# Patient Record
Sex: Male | Born: 1945
Health system: Southern US, Community
[De-identification: ages and names within clinical notes are randomized; demographics above are authoritative.]

## PROBLEM LIST (undated history)

## (undated) DIAGNOSIS — K219 Gastro-esophageal reflux disease without esophagitis: Secondary | ICD-10-CM

## (undated) DIAGNOSIS — M1990 Unspecified osteoarthritis, unspecified site: Secondary | ICD-10-CM

## (undated) DIAGNOSIS — K635 Polyp of colon: Secondary | ICD-10-CM

## (undated) DIAGNOSIS — K449 Diaphragmatic hernia without obstruction or gangrene: Secondary | ICD-10-CM

## (undated) DIAGNOSIS — E785 Hyperlipidemia, unspecified: Secondary | ICD-10-CM

## (undated) DIAGNOSIS — K222 Esophageal obstruction: Secondary | ICD-10-CM

## (undated) DIAGNOSIS — N189 Chronic kidney disease, unspecified: Secondary | ICD-10-CM

## (undated) DIAGNOSIS — E039 Hypothyroidism, unspecified: Secondary | ICD-10-CM

## (undated) HISTORY — PX: COLONOSCOPY: SHX174

## (undated) HISTORY — DX: Diaphragmatic hernia without obstruction or gangrene: K44.9

## (undated) HISTORY — DX: Polyp of colon: K63.5

## (undated) HISTORY — PX: UPPER GASTROINTESTINAL ENDOSCOPY: SHX188

## (undated) HISTORY — DX: Unspecified osteoarthritis, unspecified site: M19.90

## (undated) HISTORY — DX: Esophageal obstruction: K22.2

## (undated) HISTORY — DX: Hyperlipidemia, unspecified: E78.5

## (undated) HISTORY — DX: Chronic kidney disease, unspecified: N18.9

## (undated) HISTORY — PX: ACHILLES TENDON REPAIR: SUR1153

## (undated) HISTORY — DX: Gastro-esophageal reflux disease without esophagitis: K21.9

## (undated) HISTORY — DX: Hypothyroidism, unspecified: E03.9

---

## 2000-01-04 ENCOUNTER — Encounter (INDEPENDENT_AMBULATORY_CARE_PROVIDER_SITE_OTHER): Payer: Self-pay | Admitting: *Deleted

## 2000-01-04 ENCOUNTER — Encounter: Payer: Self-pay | Admitting: Family Medicine

## 2000-01-04 ENCOUNTER — Ambulatory Visit (HOSPITAL_COMMUNITY): Admission: RE | Admit: 2000-01-04 | Discharge: 2000-01-04 | Payer: Self-pay | Admitting: Family Medicine

## 2001-11-21 ENCOUNTER — Ambulatory Visit (HOSPITAL_COMMUNITY): Admission: RE | Admit: 2001-11-21 | Discharge: 2001-11-21 | Payer: Self-pay | Admitting: Family Medicine

## 2001-11-21 ENCOUNTER — Encounter: Payer: Self-pay | Admitting: Family Medicine

## 2002-04-30 ENCOUNTER — Encounter: Payer: Self-pay | Admitting: Internal Medicine

## 2006-12-16 ENCOUNTER — Ambulatory Visit: Payer: Self-pay | Admitting: Gastroenterology

## 2006-12-30 ENCOUNTER — Ambulatory Visit: Payer: Self-pay | Admitting: Gastroenterology

## 2006-12-30 ENCOUNTER — Encounter: Payer: Self-pay | Admitting: Gastroenterology

## 2009-07-06 ENCOUNTER — Encounter (INDEPENDENT_AMBULATORY_CARE_PROVIDER_SITE_OTHER): Payer: Self-pay | Admitting: *Deleted

## 2009-10-25 ENCOUNTER — Telehealth: Payer: Self-pay | Admitting: Internal Medicine

## 2009-11-09 ENCOUNTER — Encounter: Payer: Self-pay | Admitting: Internal Medicine

## 2009-11-09 ENCOUNTER — Telehealth: Payer: Self-pay | Admitting: Internal Medicine

## 2009-12-06 DIAGNOSIS — Z860100 Personal history of colon polyps, unspecified: Secondary | ICD-10-CM

## 2009-12-06 DIAGNOSIS — R1013 Epigastric pain: Secondary | ICD-10-CM

## 2009-12-06 DIAGNOSIS — E785 Hyperlipidemia, unspecified: Secondary | ICD-10-CM

## 2009-12-06 DIAGNOSIS — E039 Hypothyroidism, unspecified: Secondary | ICD-10-CM | POA: Insufficient documentation

## 2009-12-06 DIAGNOSIS — K219 Gastro-esophageal reflux disease without esophagitis: Secondary | ICD-10-CM

## 2009-12-06 HISTORY — DX: Personal history of colonic polyps: Z86.010

## 2009-12-06 HISTORY — DX: Gastro-esophageal reflux disease without esophagitis: K21.9

## 2009-12-06 HISTORY — DX: Personal history of colon polyps, unspecified: Z86.0100

## 2009-12-06 HISTORY — DX: Epigastric pain: R10.13

## 2009-12-06 HISTORY — DX: Hyperlipidemia, unspecified: E78.5

## 2009-12-12 ENCOUNTER — Telehealth: Payer: Self-pay | Admitting: Internal Medicine

## 2009-12-15 ENCOUNTER — Ambulatory Visit: Payer: Self-pay | Admitting: Internal Medicine

## 2009-12-15 DIAGNOSIS — N62 Hypertrophy of breast: Secondary | ICD-10-CM

## 2009-12-15 HISTORY — DX: Hypertrophy of breast: N62

## 2010-01-18 ENCOUNTER — Ambulatory Visit
Admission: RE | Admit: 2010-01-18 | Discharge: 2010-01-18 | Payer: Self-pay | Source: Home / Self Care | Attending: Internal Medicine | Admitting: Internal Medicine

## 2010-01-18 ENCOUNTER — Encounter: Payer: Self-pay | Admitting: Internal Medicine

## 2010-01-19 ENCOUNTER — Telehealth (INDEPENDENT_AMBULATORY_CARE_PROVIDER_SITE_OTHER): Payer: Self-pay | Admitting: *Deleted

## 2010-01-21 ENCOUNTER — Encounter: Payer: Self-pay | Admitting: Internal Medicine

## 2010-02-14 NOTE — Assessment & Plan Note (Signed)
Summary: per dr Juanda Chance    History of Present Illness Visit Type: Follow-up Consult Primary GI MD: Lina Sar MD Primary Provider: Leonides Sake, MD Chief Complaint: abdominal pain  History of Present Illness:   This is a 65 year old white male with subxiphoid pain extending to the epigastrium which started approximately one year ago. It occurs almost daily. It is worse in the morning when he gets up. Patient has gained a considerable amount of weight in the last several years. He does not smoke. He does not drink alcohol. He complains of odynophagia to certain foods but denies dysphagia. He takes a Zantac 150 mg p.r.n. Patient saw [facility] gastroenterology less than one year ago and was asked to undergo an upper endoscopy but declined at that time. His sister has Barrett's esophagus.   GI Review of Systems    Reports abdominal pain, acid reflux, dysphagia with solids, and  heartburn.     Location of  Abdominal pain: epigastric area.    Denies belching, bloating, chest pain, dysphagia with liquids, loss of appetite, nausea, vomiting, vomiting blood, weight loss, and  weight gain.      Reports hemorrhoids.     Denies anal fissure, black tarry stools, change in bowel habit, constipation, diarrhea, diverticulosis, fecal incontinence, heme positive stool, irritable bowel syndrome, jaundice, light color stool, liver problems, rectal bleeding, and  rectal pain. Preventive Screening-Counseling & Management  Alcohol-Tobacco     Smoking Status: never    Current Medications (verified): 1)  Synthroid 125 Mcg Tabs (Levothyroxine Sodium) .Marland Kitchen.. 1 By Mouth Once Daily 2)  Zantac 150 Mg Tabs (Ranitidine Hcl) .... Take As Needed  Allergies (verified): 1)  ! Pcn  Past History:  Past Medical History: Reviewed history from 12/06/2009 and no changes required. Current Problems:  ABDOMINAL PAIN, EPIGASTRIC (ICD-789.06) HYPERLIPIDEMIA (ICD-272.4) GERD (ICD-530.81) HYPOTHYROIDISM (ICD-244.9) COLONIC  POLYPS, HYPERPLASTIC, HX OF (ICD-V12.72)    Past Surgical History: Reviewed history from 12/06/2009 and no changes required. Right Achilles Tendon Repair  Family History: Reviewed history from 12/06/2009 and no changes required. No FH of Colon Cancer: Family History of Pancreatic Cancer:Father Family History of Diabetes: Mother Family History of Heart Disease: Mother  Social History: Reviewed history from 12/06/2009 and no changes required. Alcohol Use - no Illicit Drug Use - no Married    3 boys Patient has never smoked.  Daily Caffeine Use Smoking Status:  never  Review of Systems  The patient denies allergy/sinus, anemia, anxiety-new, arthritis/joint pain, back pain, blood in urine, breast changes/lumps, change in vision, confusion, cough, coughing up blood, depression-new, fainting, fatigue, fever, headaches-new, hearing problems, heart murmur, heart rhythm changes, itching, muscle pains/cramps, night sweats, nosebleeds, shortness of breath, skin rash, sleeping problems, sore throat, swelling of feet/legs, swollen lymph glands, thirst - excessive, urination - excessive, urination changes/pain, urine leakage, vision changes, and voice change.         Pertinent positive and negative review of systems were noted in the above HPI. All other ROS was otherwise negative.   Vital Signs:  Patient profile:   65 year old male Height:      71 inches Weight:      255 pounds BMI:     35.69 Pulse rate:   68 / minute Pulse rhythm:   regular BP sitting:   128 / 84  (left arm)  Vitals Entered By: Milford Cage NCMA (December 15, 2009 9:01 AM)  Physical Exam  General:  Well developed, well nourished, no acute distress. Eyes:  PERRLA, no  icterus. Mouth:  No deformity or lesions, dentition normal. Neck:  Supple; no masses or thyromegaly. Breasts:  right sided gynecomastia Lungs:  Clear throughout to auscultation. Heart:  Regular rate and rhythm; no murmurs, rubs,  or bruits. Abdomen:   mildly protuberant abdomen with normoactive bowel sounds. No tenderness. Liver edge at costal margin. Lower abdomen unremarkable. No ascites. No CVA tenderness. Extremities:  No clubbing, cyanosis, edema or deformities noted. Skin:  Intact without significant lesions or rashes. Psych:  Alert and cooperative. Normal mood and affect.   Impression & Recommendations:  Problem # 1:  ABDOMINAL PAIN, EPIGASTRIC (ICD-789.06) Patient has subxiphoid pain likely related to gastroesophageal reflux or esophagitis. We need to rule out Barrett's esophagus or hiatal hernia. Patient was told that he had  a hernia many years ago. He needs an upper endoscopy and biopsies. We will start him on Nexium 40 mg twice a day for one week then once a day. We discussed antireflux measures.  Problem # 2:  GYNECOMASTIA, UNILATERAL (ICD-611.1) Patient has right-sided gynecomastia likely secondary to Zantac. He will discontinue Zantac.  Other Orders: EGD (EGD)  Patient Instructions: 1)  You have been scheduled for an endoscopy. Please follow written prep instructions that were given to you today at your visit.  2)  Please pick up your prescriptions at the pharmacy. Electronic prescription(s) has already been sent for Nexium 40 mg daily. 3)  take samples of Nexium 40 mg twice a day until gone then start to prescription once a day 4)  Discontinue Zantac. 5)  Copy sent to : Holley Bouche, MD 6)  The medication list was reviewed and reconciled.  All changed / newly prescribed medications were explained.  A complete medication list was provided to the patient / caregiver. Prescriptions: NEXIUM 40 MG CPDR (ESOMEPRAZOLE MAGNESIUM) Take 1 tablet by mouth once a day  #30 x 1   Entered by:   Lamona Curl CMA (AAMA)   Authorized by:   Hart Carwin MD   Signed by:   Lamona Curl CMA (AAMA) on 12/15/2009   Method used:   Electronically to        [id number]* (retail)       [address]  [address]       [place], [place]  [address]       Ph: [phone]       Fax: [phone]   RxID:   [id number]

## 2010-02-14 NOTE — Procedures (Signed)
Summary: COLONOSCOPY  COLONOSCOPY   Imported By: Lamona Curl CMA (AAMA) 12/06/2009 15:34:50  _____________________________________________________________________  External Attachment:    Type:   Image     Comment:   External Document

## 2010-02-14 NOTE — Progress Notes (Signed)
SummaryDeboraha Stevenson GI Patient   Phone Note Outgoing Call   Call placed by: Lamona Curl CMA Duncan Dull),  December 12, 2009 11:30 AM Call placed to: Patient Summary of Call: Left message on patient's voicemail that unfortunately, we must cancel his appointment for 12/15/09 as he never made [place] aware of the fact that he was seen at [facility] November 2010 (after seeing Dr Arlyce Dice). They will need to have all [facility] records sent to Dr Juanda Chance for review before an appointment can be made. Patient to call back with questions. Initial call taken by: Lamona Curl CMA Duncan Dull),  December 12, 2009 11:32 AM     Appended Document: Mario Stevenson GI Patient Patient's wife called back today. Very unpleasant and accusatory. She states that she is very upset that I left a message on home voicemail instead of trying to call cell number. I have advised her that I have no cell phone number on file to call. She states "well that is your office's fault." Advised her that while this may be true, I still do not have the number. She also states that she is very upset that they will have to wait a month to get in for something that could be very serious. I have advised her that I understand the importance of the appointment and that I always have Dr Juanda Chance look over faxed notes when they are recieved and then get the patient in for a date close to their original date. I have told that patient's wife that I have already talked to Dr Juanda Chance about the situation. She continues to be quite irrate. I have again asked her to have records sent for review and that I will contact them again at that time.  Appended Document: [facility] Patient Patient's wife paged Dr Juanda Chance and was advised that we will see patient.

## 2010-02-14 NOTE — Letter (Signed)
Summary: New Patient letter  [facility]  [address] [place], [place] [address]   Phone: [phone]  Fax: [phone]       11/09/2009 MRN: [id number]  Mario Stevenson [address] [place], [place]  [address]  Dear Mario Stevenson,  Welcome to the Gastroenterology Division at [facility].    You are scheduled to see Dr.  Juanda Chance on 12/15/09 at 8:45 on the 3rd floor at [facility], [address]  We ask that you try to arrive at our office 15 minutes prior to your appointment time to allow for check-in.  We would like you to complete the enclosed self-administered evaluation form prior to your visit and bring it with you on the day of your appointment.  We will review it with you.  Also, please bring a complete list of all your medications or, if you prefer, bring the medication bottles and we will list them.  Please bring your insurance card so that we may make a copy of it.  If your insurance requires a referral to see a specialist, please bring your referral form from your primary care physician.  Co-payments are due at the time of your visit and may be paid by cash, check or credit card.     Your office visit will consist of a consult with your physician (includes a physical exam), any laboratory testing he/she may order, scheduling of any necessary diagnostic testing (e.g. x-ray, ultrasound, CT-scan), and scheduling of a procedure (e.g. Endoscopy, Colonoscopy) if required.  Please allow enough time on your schedule to allow for any/all of these possibilities.    If you cannot keep your appointment, please call [phone] to cancel or reschedule prior to your appointment date.  This allows [place] the opportunity to schedule an appointment for another patient in need of care.  If you do not cancel or reschedule by 5 p.m. the business day prior to your appointment date, you will be charged a $50.00 late cancellation/no-show fee.    Thank you for  choosing [facility] for your medical needs.  We appreciate the opportunity to care for you.  Please visit [place] at our website  to learn more about our practice.                     Sincerely,                                                             The Gastroenterology Division

## 2010-02-14 NOTE — Progress Notes (Signed)
Summary: Triage   Phone Note Call from Patient Call back at Home Phone [phone]   Caller: Steward Drone  Call For: Dr. Juanda Chance Reason for Call: Talk to Nurse Summary of Call: pt.'s wife is requesting to speak directly with Dr. Juanda Chance.Marland KitchenMarland KitchenWanting to sch'd EGD w/Dr. Juanda Chance. Said he saw Dr. Juanda Chance in the past....Marland KitchenMarland KitchenHe is currently Dr. Marzetta Board pt.   Informed pt.'s wife our physician's do not share pt.'s but I could send a request to switch providers.  She then requested to speak directly w/Dr. Juanda Chance Initial call taken by: Karna Christmas,  October 25, 2009 9:19 AM  Follow-up for Phone Call        Patient's wife states that Dr Juanda Chance is their MD here that patient had a colon with Arlyce Dice.  They want to see Dr Juanda Chance.  Paper chart is on your desk.  I have advised the patient's wife that I will call back once I have all the information.  Dr Juanda Chance we have reviewed the paper chart can't find hx with you, but she says she is a friend of yours.  Do you want me to send a note to Dr Arlyce Dice about changing.  Aurea Graff says you all discussed discouraging changing MD's   Please advise Follow-up by: Darcey Nora RN, CGRN,  October 25, 2009 9:32 AM  Additional Follow-up for Phone Call Additional follow up Details #1::        Macarthur Lorusso is my patient . If her husband wants to switch doctors, let him follow our guidelines for switch. Additional Follow-up by: Hart Carwin MD,  October 25, 2009 10:18 PM    Additional Follow-up for Phone Call Additional follow up Details #2::    Dr Arlyce Dice, are you okay with patient switching to Dr Juanda Chance? Dottie Nelson-Smith CMA Duncan Dull)  October 26, 2009 8:07 AM   yes. Louis Meckel MD,  October 26, 2009 8:44 AM  Dr Juanda Chance- Will you accept Patient? Dottie Nelson-Smith CMA Duncan Dull)  October 26, 2009 10:01 AM   Additional Follow-up for Phone Call Additional follow up Details #3:: Details for Additional Follow-up Action Taken: OK Additional Follow-up by: Hart Carwin MD,   October 26, 2009 2:59 PM

## 2010-02-14 NOTE — Letter (Signed)
Summary: EGD Instructions  [facility]  [address] [place], [place] [address]   Phone: [phone]  Fax: [phone]       Mario Stevenson    March 04, 1945    MRN: [id number]       Procedure Day Dorna Bloom: Wednesday 01/18/10     Arrival Time: 1:00 pm     Procedure Time: 2:00 pm     Location of Procedure:                    _x _ [facility] (4th Floor)  PREPARATION FOR ENDOSCOPY   On 01/18/10 THE DAY OF THE PROCEDURE:  1.   No solid foods, milk or milk products are allowed after midnight the night before your procedure.  2.   Do not drink anything colored red or purple.  Avoid juices with pulp.  No orange juice.  3.  You may drink clear liquids until 12:00 pm, which is 2 hours before your procedure.                                                                                                CLEAR LIQUIDS INCLUDE: Water Jello Ice Popsicles Tea (sugar ok, no milk/cream) Powdered fruit flavored drinks Coffee (sugar ok, no milk/cream) Gatorade Juice: apple, white grape, white cranberry  Lemonade Clear bullion, consomm, broth Carbonated beverages (any kind) Strained chicken noodle soup Hard Candy   MEDICATION INSTRUCTIONS  Unless otherwise instructed, you should take regular prescription medications with a small sip of water as early as possible the morning of your procedure. \                    OTHER INSTRUCTIONS  You will need a responsible adult at least 64 years of age to accompany you and drive you home.   This person must remain in the waiting room during your procedure.  Wear loose fitting clothing that is easily removed.  Leave jewelry and other valuables at home.  However, you may wish to bring a book to read or an iPod/MP3 player to listen to music as you wait for your procedure to start.  Remove all body piercing jewelry and leave at home.  Total time from sign-in until discharge is approximately 2-3 hours.  You  should go home directly after your procedure and rest.  You can resume normal activities the day after your procedure.  The day of your procedure you should not:   Drive   Make legal decisions   Operate machinery   Drink alcohol   Return to work  You will receive specific instructions about eating, activities and medications before you leave.    The above instructions have been reviewed and explained to me by   _______________________    I fully understand and can verbalize these instructions _____________________________ Date _________

## 2010-02-14 NOTE — Progress Notes (Signed)
Summary: Office Visit  Office Visit   Imported By: Lamona Curl CMA (AAMA) 12/06/2009 15:37:29  _____________________________________________________________________  External Attachment:    Type:   Image     Comment:   External Document

## 2010-02-14 NOTE — Letter (Signed)
Summary: [facility]-Office Note  [facility] Note   Imported By: Lamona Curl CMA (AAMA) 12/12/2009 13:47:07  _____________________________________________________________________  External Attachment:    Type:   Image     Comment:   External Document

## 2010-02-14 NOTE — Progress Notes (Signed)
Summary: triage   Phone Note Call from Patient Call back at Home Phone [phone]   Caller: wife, Steward Drone Call For: Dr. Juanda Chance Reason for Call: Talk to Nurse Summary of Call: pt having upper abd pain... asked to switch from Dr. Arlyce Dice to Dr. Juanda Chance, per EMR switch has been approved... tried to sch pt's first appt with Dr. Juanda Chance for the upper abd pain... offered next availale NP3 and wife instantly became angry athaving to wait two weeks for switch approvaland now having to wait another month for an appt... explained to wife that when pt chose to switch physicians, it is typical for this to take 2 weeks... also informed wife that pt has chosen to switch to the physician who is usually sched out the farthest within our practice... wife disregarded what I said and told me that I dont understand, her husband is needing to be seen NOW and wants an EGD... explained to wife that pt must see Dr. Juanda Chance in the office for evaluation before she will perform an EGD if that is even necessary Initial call taken by: Vallarie Mare,  November 09, 2009 11:29 AM  Follow-up for Phone Call        I spoke with the pt's wife.  She was very pleasant. She however was upset he will have to wait until Dec 15th to be seen.  I have moved this appt up to 12/15/09 8:45.   He is having some upper abdominal pain and his primary care  Dr Tiburcio Pea suggested they come here. Pt is taking zantac q am only with no relief.  I have asked her to have him increase it to two times a day.  I have asked the wife to contact Dr Johnathan Hausen office for the office notes and have them faxed here for review.  I have placed him on the cancelation list.  Wife is aware we will review notes once we recieve them and if appropriate move up the appt.  New pt paperwork mailed to the pt.  Pt's wife very agreeable to the above plan. Darcey Nora, RN Follow-up by: Jesse Fall RN,  November 09, 2009 12:09 PM

## 2010-02-16 NOTE — Miscellaneous (Signed)
Summary: omeprazole prescription  Clinical Lists Changes  Medications: Added new medication of OMEPRAZOLE 40 MG  CPDR (OMEPRAZOLE) 1 twice a day 30 minutes before meals - Signed Rx of OMEPRAZOLE 40 MG  CPDR (OMEPRAZOLE) 1 twice a day 30 minutes before meals;  #60 x 6;  Signed;  Entered by: Laverna Peace RN;  Authorized by: Hart Carwin MD;  Method used: Electronically to [facility] [phone]*, [address] [place], [place], [place]  [address], Ph: [phone], Fax: [phone]    Prescriptions: OMEPRAZOLE 40 MG  CPDR (OMEPRAZOLE) 1 twice a day 30 minutes before meals  #60 x 6   Entered by:   Laverna Peace RN   Authorized by:   Hart Carwin MD   Signed by:   Laverna Peace RN on 01/18/2010   Method used:   Electronically to        [facility] [address]* (retail)       [address]       [place], [place]  [address]       Ph: [phone]       Fax: [phone]   RxID:   [id number]

## 2010-02-16 NOTE — Progress Notes (Signed)
   Phone Note Outgoing Call Call back at [facility] Phone [phone]   Call placed by: Jesse Fall, RN Call placed to: Patient Summary of Call: Spoke with patient's wife. Scheduled 6 week f/u OV after EGD for 02/27/2010 at 10:45 AM. Initial call taken by: Jesse Fall RN,  January 19, 2010 10:46 AM     Appended Document:  Spoke with patient's wife and changed appointment to 8:45 AM on 02/27/10.

## 2010-02-16 NOTE — Letter (Signed)
Summary: Patient [facility] Biopsy Results  [facility]  [address] [place], [place] [address]   Phone: [phone]  Fax: [phone]        January 21, 2010 MRN: [id number]    UNO ESAU [address] [place], [place]  [address]    Dear Mario Stevenson,  I am pleased to inform you that the biopsies taken during your recent endoscopic examination did not show any evidence of cancer upon pathologic examination.The biopsies show inflamed esophagus due to reflux. There is no evidence of Barrett's esophagus  Additional information/recommendations:  __No further action is needed at this time.  Please follow-up with      your primary care physician for your other healthcare needs.  _x_ Please call [phone] to schedule a return visit to review      your condition.  _x_ Continue with the treatment plan as outlined on the day of your      exam. s.   Please call us if you are having persistent problems or have questions about your condition that have not been fully answered at this time.  Sincerely,  Hart Carwin MD  This letter has been electronically signed by your physician.  Appended Document: Patient Notice-Endo Biopsy Results Letter mailed

## 2010-02-16 NOTE — Procedures (Signed)
Summary: Upper Endoscopy  Patient: Mario Stevenson Note: All result statuses are Final unless otherwise noted.  Tests: (1) Upper Endoscopy (EGD)   EGD Upper Endoscopy       DONE     [facility]     [address]     [place], [place]  [address]           ENDOSCOPY PROCEDURE REPORT           PATIENT:  [name], [name]  MR#:  [id number]     BIRTHDATE:  August 02, 1945, 64 yrs. old  GENDER:  male           ENDOSCOPIST:  Hedwig Morton. Juanda Chance, MD     Referred by:  Holley Bouche, M.D.           PROCEDURE DATE:  01/18/2010     PROCEDURE:  EGD with biopsy, [address], EGD with dilatation over     guidewire     ASA CLASS:  Class II     INDICATIONS:  chest pain odynophagia,, sister has Barrett's     esophagus           MEDICATIONS:   Versed 7 mg, Fentanyl 50 mcg     TOPICAL ANESTHETIC:  Exactacain Spray           DESCRIPTION OF PROCEDURE:   After the risks benefits and     alternatives of the procedure were thoroughly explained, informed     consent was obtained.  The LB [id number] endoscope was     introduced through the mouth and advanced to the second portion of     the duodenum, without limitations.  The instrument was slowly     withdrawn as the mucosa was fully examined.     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 Savary dilation over a guidewire     14,15,16 mm dilators advanced over the guidewire without     difficulty  A hiatal hernia was found (see image3, image2, image1,     image4, and image8). 4 cm nonreducible hiatal hernia with Sheria Lang     erosions  Otherwise the examination was normal (see image6,     image7, and image5).    Retroflexed views revealed no     abnormalities.    The scope was then withdrawn from the patient     and the procedure  completed.           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  ______________________________     Hedwig Morton. Juanda Chance, MD           CC:           n.     eSIGNED:   Hedwig Morton. Marene Gilliam at 01/18/2010 02:47 PM           Orland Mustard, [id number]  Note: An exclamation mark (!) indicates a result that was not dispersed into the flowsheet. Document Creation Date: 01/18/2010 2:48 PM _______________________________________________________________________  (1) Order result status: Final Collection or observation date-time: 01/18/2010 14:36 Requested date-time:  Receipt date-time:  Reported date-time:  Referring Physician:   Ordering Physician: Lina Sar [phone]) Specimen Source:  Source: Launa Grill Order Number: [id number] Lab site:

## 2010-02-27 ENCOUNTER — Encounter: Payer: Self-pay | Admitting: Internal Medicine

## 2010-02-27 ENCOUNTER — Ambulatory Visit (INDEPENDENT_AMBULATORY_CARE_PROVIDER_SITE_OTHER): Payer: BC Managed Care – PPO | Admitting: Internal Medicine

## 2010-02-27 DIAGNOSIS — K222 Esophageal obstruction: Secondary | ICD-10-CM

## 2010-02-27 DIAGNOSIS — K449 Diaphragmatic hernia without obstruction or gangrene: Secondary | ICD-10-CM

## 2010-02-27 DIAGNOSIS — K219 Gastro-esophageal reflux disease without esophagitis: Secondary | ICD-10-CM

## 2010-02-27 HISTORY — DX: Esophageal obstruction: K22.2

## 2010-02-27 HISTORY — DX: Diaphragmatic hernia without obstruction or gangrene: K44.9

## 2010-03-08 NOTE — Assessment & Plan Note (Signed)
Summary: F/u from EGD    History of Present Illness Visit Type: Follow-up Visit Primary GI MD: Mario Sar MD Primary Provider: Leonides Sake, MD Requesting Provider: na Chief Complaint: F/u from EGD. Pt states that he is doing good and denies any GI complaints  History of Present Illness:   This is a 65 year old white male with gastroesophageal reflux and a newly diagnosed esophageal stricture. He has  a hernia based on an upper endoscopy on 01/18/10. He was dilated to 16 mm with Savary dilators.  He is currently asymptomatic on omeprazole 20 mg twice a day and most recently only once a day. An upper endoscopy was done because of recurrent chest pain. Patient's sister has Barrett's esophagus but his biopsies were negative for Barrett's esophagus.   GI Review of Systems      Denies abdominal pain, acid reflux, belching, bloating, chest pain, dysphagia with liquids, dysphagia with solids, heartburn, loss of appetite, nausea, vomiting, vomiting blood, weight loss, and  weight gain.        Denies anal fissure, black tarry stools, change in bowel habit, constipation, diarrhea, diverticulosis, fecal incontinence, heme positive stool, hemorrhoids, irritable bowel syndrome, jaundice, light color stool, liver problems, rectal bleeding, and  rectal pain.    Current Medications (verified): 1)  Synthroid 125 Mcg Tabs (Levothyroxine Sodium) .Marland Kitchen.. 1 By Mouth Once Daily 2)  Omeprazole 40 Mg  Cpdr (Omeprazole) .... One Capsule By Mouth Once A Day 30 Minutes Before Meals  Allergies (verified): 1)  ! Pcn  Past History:  Past Medical History: ESOPHAGEAL STRICTURE (ICD-530.3) HIATAL HERNIA (ICD-553.3) GYNECOMASTIA, UNILATERAL (ICD-611.1) ABDOMINAL PAIN, EPIGASTRIC (ICD-789.06) HYPERLIPIDEMIA (ICD-272.4) GERD (ICD-530.81) HYPOTHYROIDISM (ICD-244.9) COLONIC POLYPS, HYPERPLASTIC, HX OF (ICD-V12.72)      Past Surgical History: Reviewed history from 12/06/2009 and no changes required. Right  Achilles Tendon Repair  Family History: Reviewed history from 12/06/2009 and no changes required. No FH of Colon Cancer: Family History of Pancreatic Cancer:Father Family History of Diabetes: Mother Family History of Heart Disease: Mother  Social History: Reviewed history from 12/15/2009 and no changes required. Alcohol Use - no Illicit Drug Use - no Married    3 boys Patient has never smoked.  Daily Caffeine Use  Review of Systems  The patient denies allergy/sinus, anemia, anxiety-new, arthritis/joint pain, back pain, blood in urine, breast changes/lumps, change in vision, confusion, cough, coughing up blood, depression-new, fainting, fatigue, fever, headaches-new, hearing problems, heart murmur, heart rhythm changes, itching, menstrual pain, muscle pains/cramps, night sweats, nosebleeds, pregnancy symptoms, shortness of breath, skin rash, sleeping problems, sore throat, swelling of feet/legs, swollen lymph glands, thirst - excessive , urination - excessive , urination changes/pain, urine leakage, vision changes, and voice change.         Pertinent positive and negative review of systems were noted in the above HPI. All other ROS was otherwise negative.   Vital Signs:  Patient profile:   65 year old male Height:      71 inches Weight:      254 pounds BMI:     35.55 BSA:     2.34 Pulse rate:   62 / minute Pulse rhythm:   regular BP sitting:   132 / 74  (left arm) Cuff size:   regular  Vitals Entered By: Ok Anis CMA (February 27, 2010 8:29 AM)  Physical Exam  General:  Well developed, well nourished, no acute distress. Mouth:  No deformity or lesions, dentition normal. Neck:  Supple; no masses or thyromegaly. Lungs:  Clear throughout to  auscultation. Heart:  Regular rate and rhythm; no murmurs, rubs,  or bruits. Abdomen:  Soft, nontender and nondistended. No masses, hepatosplenomegaly or hernias noted. Normal bowel sounds. Skin:  Intact without significant lesions or  rashes. Psych:  Alert and cooperative. Normal mood and affect.   Impression & Recommendations:  Problem # 1:  ESOPHAGEAL STRICTURE (ICD-530.3) Patient is currently asymptomatic. He is to continue omeprazole 20 mg daily as well as weight loss and antireflux measures.  Problem # 2:  HIATAL HERNIA (ICD-553.3) as above.  Patient Instructions: 1)  Continue antireflux measures. 2)  Continue weight loss. 3)  Omeprazole 20 mg daily or twice a day for breakthrough symptoms. 4)  Office visit one year. 5)  Copy sent to : Dr R.Harris 6)  The medication list was reviewed and reconciled.  All changed / newly prescribed medications were explained.  A complete medication list was provided to the patient / caregiver.

## 2011-01-19 ENCOUNTER — Other Ambulatory Visit: Payer: Self-pay | Admitting: Internal Medicine

## 2011-10-15 ENCOUNTER — Telehealth: Payer: Self-pay | Admitting: Internal Medicine

## 2011-10-15 MED ORDER — OMEPRAZOLE 40 MG PO CPDR: 40.0000 mg | DELAYED_RELEASE_CAPSULE | Freq: Two times a day (BID) | ORAL | Status: DC

## 2011-10-15 NOTE — Telephone Encounter (Signed)
Patient is overdue for appt. His pharmacy apparently never told him he was due for office visit. I have scheduled patient for an appointment (first available) and have sent a 3 month supply to his pharmacy ([facility]) until his appointment. His wife scheduled appointment and verbalizes understanding that we will not be able to send additional medication if an appointment is not kept.

## 2011-11-16 ENCOUNTER — Encounter: Payer: Self-pay | Admitting: *Deleted

## 2011-12-07 ENCOUNTER — Encounter: Payer: Self-pay | Admitting: Internal Medicine

## 2011-12-07 ENCOUNTER — Ambulatory Visit (INDEPENDENT_AMBULATORY_CARE_PROVIDER_SITE_OTHER): Payer: Medicare Other | Admitting: Internal Medicine

## 2011-12-07 VITALS — BP 126/80 | HR 88 | Ht 69.5 in | Wt 256.1 lb

## 2011-12-07 DIAGNOSIS — K219 Gastro-esophageal reflux disease without esophagitis: Secondary | ICD-10-CM | POA: Diagnosis not present

## 2011-12-07 DIAGNOSIS — K222 Esophageal obstruction: Secondary | ICD-10-CM | POA: Diagnosis not present

## 2011-12-07 MED ORDER — OMEPRAZOLE 40 MG PO CPDR: 40.0000 mg | DELAYED_RELEASE_CAPSULE | Freq: Every day | ORAL | Status: DC

## 2011-12-07 NOTE — Progress Notes (Signed)
Mario Stevenson 1945-10-04 MRN [id number]   History of Present Illness:  This is a 66 year old white male with gastroesophageal reflux disease controlled on Prilosec 40 mg daily. He underwent an upper endoscopy and esophageal stricture dilation in January 2012. He was found to have a small hiatal hernia and a mild esophageal stricture. There was no evidence for Barrett's esophagus. He is doing very well. Patient denies cough, hoarseness or chest pain. His last colonoscopy in December 2008 showed a hyperplastic polyp. He is due for a repeat colonoscopy in December 2018.   Past Medical History  Diagnosis Date  . GERD (gastroesophageal reflux disease)   . Esophageal stricture   . Hiatal hernia   . Hyperlipidemia   . Hypothyroidism   . Hyperplastic colon polyp    Past Surgical History  Procedure Date  . Achilles tendon repair     right    reports that he has never smoked. He has never used smokeless tobacco. He reports that he does not drink alcohol or use illicit drugs. family history includes Diabetes in his mother; Heart disease in his mother; and Pancreatic cancer in his father.  There is no history of Colon cancer. Allergies  Allergen Reactions  . Penicillins     REACTION: syncope        Review of Systems: Negative for dysphagia heartburn odynophagia  The remainder of the 10 point ROS is negative except as outlined in H&P   Physical Exam: General appearance  Well developed, in no distress. Eyes- non icteric. HEENT nontraumatic, normocephalic. Mouth no lesions, tongue papillated, no cheilosis. Neck supple without adenopathy, thyroid not enlarged, no carotid bruits, no JVD. Lungs Clear to auscultation bilaterally. Cor normal S1, normal S2, regular rhythm, no murmur,  quiet precordium. Abdomen: Soft nontender normoactive bowel sounds. Rectal: Not done. Extremities no pedal edema. Skin no lesions. Neurological alert and oriented x 3. Psychological normal mood and  affect.  Assessment and Plan:  Problem #1 Chronic gastroesophageal reflux disease with esophageal stricture currently under good control with Prilosec 40 mg daily. We will refill his Prilosec for 1 year. We have discussed antireflux measures including weight loss and head of the bed elevation.  Problem #2 Colorectal screening. Patient has a history of a hyperplastic polyp. A recall colonoscopy will be due in December 2018.  12/07/2011 Lina Sar

## 2011-12-07 NOTE — Patient Instructions (Addendum)
We have sent the following medications to your mail in pharmacy: Prilosec  CC: Dr Johny Blamer

## 2012-02-12 DIAGNOSIS — Z7189 Other specified counseling: Secondary | ICD-10-CM | POA: Diagnosis not present

## 2012-02-12 DIAGNOSIS — E039 Hypothyroidism, unspecified: Secondary | ICD-10-CM | POA: Diagnosis not present

## 2012-02-12 DIAGNOSIS — Z23 Encounter for immunization: Secondary | ICD-10-CM | POA: Diagnosis not present

## 2012-05-03 ENCOUNTER — Other Ambulatory Visit: Payer: Self-pay | Admitting: Internal Medicine

## 2012-08-06 ENCOUNTER — Other Ambulatory Visit: Payer: Self-pay | Admitting: Internal Medicine

## 2012-09-04 ENCOUNTER — Other Ambulatory Visit: Payer: Self-pay | Admitting: Dermatology

## 2012-09-04 DIAGNOSIS — L819 Disorder of pigmentation, unspecified: Secondary | ICD-10-CM | POA: Diagnosis not present

## 2012-09-04 DIAGNOSIS — C44319 Basal cell carcinoma of skin of other parts of face: Secondary | ICD-10-CM | POA: Diagnosis not present

## 2012-09-04 DIAGNOSIS — L820 Inflamed seborrheic keratosis: Secondary | ICD-10-CM | POA: Diagnosis not present

## 2012-09-04 DIAGNOSIS — D235 Other benign neoplasm of skin of trunk: Secondary | ICD-10-CM | POA: Diagnosis not present

## 2012-09-05 DIAGNOSIS — E039 Hypothyroidism, unspecified: Secondary | ICD-10-CM | POA: Diagnosis not present

## 2012-09-05 DIAGNOSIS — R5381 Other malaise: Secondary | ICD-10-CM | POA: Diagnosis not present

## 2012-10-31 ENCOUNTER — Other Ambulatory Visit: Payer: Self-pay | Admitting: Internal Medicine

## 2012-11-25 DIAGNOSIS — Z85828 Personal history of other malignant neoplasm of skin: Secondary | ICD-10-CM | POA: Diagnosis not present

## 2013-05-27 DIAGNOSIS — Z8249 Family history of ischemic heart disease and other diseases of the circulatory system: Secondary | ICD-10-CM | POA: Diagnosis not present

## 2013-05-27 DIAGNOSIS — Z125 Encounter for screening for malignant neoplasm of prostate: Secondary | ICD-10-CM | POA: Diagnosis not present

## 2013-05-27 DIAGNOSIS — E039 Hypothyroidism, unspecified: Secondary | ICD-10-CM | POA: Diagnosis not present

## 2013-05-27 DIAGNOSIS — R0602 Shortness of breath: Secondary | ICD-10-CM | POA: Diagnosis not present

## 2013-06-12 ENCOUNTER — Other Ambulatory Visit: Payer: Self-pay | Admitting: Internal Medicine

## 2013-07-13 DIAGNOSIS — M1710 Unilateral primary osteoarthritis, unspecified knee: Secondary | ICD-10-CM | POA: Diagnosis not present

## 2013-10-05 ENCOUNTER — Other Ambulatory Visit: Payer: Self-pay | Admitting: Internal Medicine

## 2013-10-27 ENCOUNTER — Telehealth: Payer: Self-pay | Admitting: Internal Medicine

## 2013-10-27 MED ORDER — OMEPRAZOLE 40 MG PO CPDR: DELAYED_RELEASE_CAPSULE | ORAL | Status: DC

## 2013-10-27 NOTE — Telephone Encounter (Signed)
Patient has scheduled an appointment with Dr Olevia Perches for routine follow up on 12/15/13. I will send 1 3 month supply of medication until his appointment with the understanding that I will not refill again if he does not come for 12/15/13 appointment.

## 2013-12-15 ENCOUNTER — Ambulatory Visit: Payer: Medicare Other | Admitting: Internal Medicine

## 2013-12-16 ENCOUNTER — Encounter: Payer: Self-pay | Admitting: Internal Medicine

## 2013-12-16 NOTE — Progress Notes (Signed)
The patient's chart has been reviewed by Dr. Delfin Edis  and the recommendations are noted below.  Follow-up advised. Contact and schedule patient for next available appointment.  Outcome of communication with the patient:  Letter sent.

## 2014-01-20 DIAGNOSIS — Z23 Encounter for immunization: Secondary | ICD-10-CM | POA: Diagnosis not present

## 2014-03-24 DIAGNOSIS — M1711 Unilateral primary osteoarthritis, right knee: Secondary | ICD-10-CM | POA: Diagnosis not present

## 2014-07-29 DIAGNOSIS — J029 Acute pharyngitis, unspecified: Secondary | ICD-10-CM | POA: Diagnosis not present

## 2014-09-23 DIAGNOSIS — Z23 Encounter for immunization: Secondary | ICD-10-CM | POA: Diagnosis not present

## 2014-09-23 DIAGNOSIS — K219 Gastro-esophageal reflux disease without esophagitis: Secondary | ICD-10-CM | POA: Diagnosis not present

## 2014-09-23 DIAGNOSIS — E039 Hypothyroidism, unspecified: Secondary | ICD-10-CM | POA: Diagnosis not present

## 2014-09-23 DIAGNOSIS — Z125 Encounter for screening for malignant neoplasm of prostate: Secondary | ICD-10-CM | POA: Diagnosis not present

## 2014-09-23 DIAGNOSIS — M109 Gout, unspecified: Secondary | ICD-10-CM | POA: Diagnosis not present

## 2014-10-18 DIAGNOSIS — H25011 Cortical age-related cataract, right eye: Secondary | ICD-10-CM | POA: Diagnosis not present

## 2014-10-18 DIAGNOSIS — H2511 Age-related nuclear cataract, right eye: Secondary | ICD-10-CM | POA: Diagnosis not present

## 2014-10-18 DIAGNOSIS — H18411 Arcus senilis, right eye: Secondary | ICD-10-CM | POA: Diagnosis not present

## 2014-10-18 DIAGNOSIS — H02839 Dermatochalasis of unspecified eye, unspecified eyelid: Secondary | ICD-10-CM | POA: Diagnosis not present

## 2014-12-16 DIAGNOSIS — D1721 Benign lipomatous neoplasm of skin and subcutaneous tissue of right arm: Secondary | ICD-10-CM | POA: Diagnosis not present

## 2014-12-16 DIAGNOSIS — M109 Gout, unspecified: Secondary | ICD-10-CM | POA: Diagnosis not present

## 2015-01-03 DIAGNOSIS — M170 Bilateral primary osteoarthritis of knee: Secondary | ICD-10-CM | POA: Diagnosis not present

## 2015-02-16 DIAGNOSIS — M109 Gout, unspecified: Secondary | ICD-10-CM | POA: Diagnosis not present

## 2015-03-22 DIAGNOSIS — L821 Other seborrheic keratosis: Secondary | ICD-10-CM | POA: Diagnosis not present

## 2015-03-22 DIAGNOSIS — L570 Actinic keratosis: Secondary | ICD-10-CM | POA: Diagnosis not present

## 2015-03-22 DIAGNOSIS — Z23 Encounter for immunization: Secondary | ICD-10-CM | POA: Diagnosis not present

## 2015-03-22 DIAGNOSIS — L812 Freckles: Secondary | ICD-10-CM | POA: Diagnosis not present

## 2015-03-30 DIAGNOSIS — M1711 Unilateral primary osteoarthritis, right knee: Secondary | ICD-10-CM | POA: Diagnosis not present

## 2015-05-23 DIAGNOSIS — M25561 Pain in right knee: Secondary | ICD-10-CM | POA: Diagnosis not present

## 2015-05-23 DIAGNOSIS — M1711 Unilateral primary osteoarthritis, right knee: Secondary | ICD-10-CM | POA: Diagnosis not present

## 2015-05-27 DIAGNOSIS — Z01818 Encounter for other preprocedural examination: Secondary | ICD-10-CM | POA: Diagnosis not present

## 2015-05-27 DIAGNOSIS — E039 Hypothyroidism, unspecified: Secondary | ICD-10-CM | POA: Diagnosis not present

## 2015-05-27 DIAGNOSIS — N183 Chronic kidney disease, stage 3 (moderate): Secondary | ICD-10-CM | POA: Diagnosis not present

## 2015-05-27 DIAGNOSIS — M1711 Unilateral primary osteoarthritis, right knee: Secondary | ICD-10-CM | POA: Diagnosis not present

## 2015-05-27 DIAGNOSIS — E6609 Other obesity due to excess calories: Secondary | ICD-10-CM | POA: Diagnosis not present

## 2015-05-27 DIAGNOSIS — M109 Gout, unspecified: Secondary | ICD-10-CM | POA: Diagnosis not present

## 2015-06-08 DIAGNOSIS — M1711 Unilateral primary osteoarthritis, right knee: Secondary | ICD-10-CM | POA: Diagnosis not present

## 2015-06-08 DIAGNOSIS — Z79899 Other long term (current) drug therapy: Secondary | ICD-10-CM | POA: Diagnosis not present

## 2015-06-08 DIAGNOSIS — Z01818 Encounter for other preprocedural examination: Secondary | ICD-10-CM | POA: Diagnosis not present

## 2015-06-08 DIAGNOSIS — E0789 Other specified disorders of thyroid: Secondary | ICD-10-CM | POA: Diagnosis not present

## 2015-06-09 ENCOUNTER — Encounter: Payer: Self-pay | Admitting: Gastroenterology

## 2015-06-09 ENCOUNTER — Other Ambulatory Visit (INDEPENDENT_AMBULATORY_CARE_PROVIDER_SITE_OTHER): Payer: Medicare Other

## 2015-06-09 ENCOUNTER — Ambulatory Visit (INDEPENDENT_AMBULATORY_CARE_PROVIDER_SITE_OTHER): Payer: Medicare Other | Admitting: Gastroenterology

## 2015-06-09 VITALS — BP 126/70 | HR 62 | Ht 69.5 in | Wt 259.0 lb

## 2015-06-09 DIAGNOSIS — R1030 Lower abdominal pain, unspecified: Secondary | ICD-10-CM

## 2015-06-09 DIAGNOSIS — R748 Abnormal levels of other serum enzymes: Secondary | ICD-10-CM | POA: Diagnosis not present

## 2015-06-09 DIAGNOSIS — N289 Disorder of kidney and ureter, unspecified: Secondary | ICD-10-CM | POA: Diagnosis not present

## 2015-06-09 DIAGNOSIS — K219 Gastro-esophageal reflux disease without esophagitis: Secondary | ICD-10-CM | POA: Diagnosis not present

## 2015-06-09 DIAGNOSIS — R109 Unspecified abdominal pain: Secondary | ICD-10-CM | POA: Diagnosis not present

## 2015-06-09 LAB — COMPREHENSIVE METABOLIC PANEL
ALK PHOS: 142 U/L — AB (ref 39–117)
ALT: 31 U/L (ref 0–53)
AST: 27 U/L (ref 0–37)
Albumin: 4.3 g/dL (ref 3.5–5.2)
BUN: 20 mg/dL (ref 6–23)
CO2: 30 mEq/L (ref 19–32)
CREATININE: 1.36 mg/dL (ref 0.40–1.50)
Calcium: 9.5 mg/dL (ref 8.4–10.5)
Chloride: 109 mEq/L (ref 96–112)
GFR: 55.26 mL/min — ABNORMAL LOW (ref >60.00)
Glucose, Bld: 93 mg/dL (ref 70–99)
Potassium: 5.2 mEq/L — ABNORMAL HIGH (ref 3.5–5.1)
SODIUM: 143 meq/L (ref 135–145)
TOTAL PROTEIN: 6.6 g/dL (ref 6.0–8.3)
Total Bilirubin: 0.8 mg/dL (ref 0.2–1.2)

## 2015-06-09 LAB — HEPATITIS C ANTIBODY: HCV AB: NEGATIVE

## 2015-06-09 LAB — GAMMA GT: GGT: 128 U/L — ABNORMAL HIGH (ref 7–51)

## 2015-06-09 LAB — HEPATITIS B SURFACE ANTIGEN: HEP B S AG: NEGATIVE

## 2015-06-09 MED ORDER — OMEPRAZOLE 20 MG PO CPDR: 20.0000 mg | DELAYED_RELEASE_CAPSULE | Freq: Every day | ORAL | Status: DC

## 2015-06-09 NOTE — Patient Instructions (Addendum)
  Your physician has requested that you go to the basement for the lab work before leaving today.    Change your omeprazole 40mg  daily to omeprazole 20mg  daily, rx sent in to your mail order pharmacy per pt request.     I appreciate the opportunity to care for you.

## 2015-06-09 NOTE — Progress Notes (Signed)
HPI :  70 y/o male here for a follow up visit. Previously followed by Dr. Olevia Perches but he is a new patient to me. Last seen in 2013. He has a history of GERD.   Patient complains of abdominal pain and GERD today:  He reports some lower to mid abdominal discomfort which can radiate into the lower back. He reports ongoing for 3-4 months or so. He reports around or just below the umbilicus. He reports he feels something there all the time, sometimes worse than others. Rated 2/10 dull ache at baseline, and then 4/10. He reports about once or twice per week it worsens from his baseline. No trauma to the abdomen. He doesn't think it's related to eating, eating does not make it worse. He reports some baseline constipation which is mild, sometimes he reports some mild relief of pain with a bowel movement but it does not resolve it. He is having a bowel movement every day or so. No blood in the stools. No weight loss. No FH of colon cancer. Father had pancreatic cancer, diagnosed age 33. He sleeps okay without pain.   He reports he previously had some dysphagia in the past which responded to dilation in the past. He reports rare dysphagia now but not too bothersome. He denies pyrosis or regurgitation, which is well controlled with prilosec 40mg  daily. If he misses a dose he will have some symptoms recur. He takes zantac PRN. Some nocturnal symptoms.  He otherwise reports some ongoing "gurgling" in his lower abdomen which comes and goes.   Of note, he has labs from his primary care done, showing some evidence of mild renal insufficiency. He endorses using Aleve daily for joint pains.  Of note AP has been elevated on the last few lab draws with normal ALT. He denies any history of known liver disease.   Labs from 05/27/15 CBC - WBC 7.1, Hgb 14.9, HCT 45.6, PLT 198 BUN 26, Cr 1.46 AP 160, ALT 37, AST 34, TI bil 0.4  Endoscopic history: EGD 01/18/10 - benign stricture at GEJ, dilated to 42 Fr, hiatal  hernia Colonoscopy 12/08 - small sigmoid hyperplastic polyp  Past Medical History  Diagnosis Date  . GERD (gastroesophageal reflux disease)   . Esophageal stricture   . Hiatal hernia   . Hyperlipidemia   . Hypothyroidism   . Hyperplastic colon polyp      Past Surgical History  Procedure Laterality Date  . Achilles tendon repair      right   Family History  Problem Relation Age of Onset  . Colon cancer Neg Hx   . Pancreatic cancer Father   . Diabetes Mother   . Heart disease Mother    Social History  Substance Use Topics  . Smoking status: Never Smoker   . Smokeless tobacco: Never Used  . Alcohol Use: No   Current Outpatient Prescriptions  Medication Sig Dispense Refill  . Colchicine 0.6 MG CAPS Take by mouth daily.    Marland Kitchen levothyroxine (SYNTHROID, LEVOTHROID) 125 MCG tablet Take 125 mcg by mouth daily.    Marland Kitchen omeprazole (PRILOSEC) 20 MG capsule Take 20 mg by mouth daily.    Marland Kitchen omeprazole (PRILOSEC) 20 MG capsule Take 1 capsule (20 mg total) by mouth daily. 90 capsule 3   No current facility-administered medications for this visit.   Allergies  Allergen Reactions  . Penicillins     REACTION: syncope     Review of Systems: All systems reviewed and negative except where noted  in HPI.   No results found for: WBC, HGB, HCT, MCV, PLT   Physical Exam: BP 126/70 mmHg  Pulse 62  Ht 5' 9.5" (1.765 m)  Wt 259 lb (117.482 kg)  BMI 37.71 kg/m2 Constitutional: Pleasant,well-developed, male in no acute distress. HEENT: Normocephalic and atraumatic. Conjunctivae are normal. No scleral icterus. Neck supple.  Cardiovascular: Normal rate, regular rhythm.  Pulmonary/chest: Effort normal and breath sounds normal. No wheezing, rales or rhonchi. Abdominal: Soft, nondistended, nontender. Could not reproduce pain on palpation. Bowel sounds active throughout. There are no masses palpable. No hepatomegaly. Extremities: no edema Lymphadenopathy: No cervical adenopathy  noted. Neurological: Alert and oriented to person place and time. Skin: Skin is warm and dry. No rashes noted. Psychiatric: Normal mood and affect. Behavior is normal.   ASSESSMENT AND PLAN: 70 y/o male here for following, we addressed the following issues:  Abdominal pain - mid to lower abdominal pain with radiation to the lower back at times, present constantly, with fluctuating pain level. No clear precipitants. His father passed from pancreatic cancer at age 45s, he is concerned about this possibility. I offered him CT imaging to ensure the pancreas is normal and further evaluate his pain. His colonoscopy was 9 years ago and normal, I doubt colonic mass lesion but will await CT imaging, otherwise due next year for screening. Of note, he appears to have some chronic mild renal insufficiency noted on labs. I'll repeat renal function now, as if rising we may need to perform a non-IV contrast CT. I will await labs and get in touch with them to determine what type of CT will be ordered.   Renal insufficiency - I otherwise recommend he see his primary care about his renal function, and avoid all NSAIDs, as he is using them daily for joint pains. Renal function abnormality could be due to NSAIDs. He also takes chronic PPI for his GERD which can increase the risk of CKD. Recommend lowest dose of PPI needed to control his symptoms, and in this light would try to decrease omeprazole to 20mg  if possible. Dysphagia not bothering him at present time.   GERD - as above, will reduce dose of PPI  Elevated AP - noted incidentally and appears chronic. Will repeat to see if this is intervally rising, also add GGT, AMA, and initial viral hepatitis serologies. If it remains elevated without clear etiology may consider further serologies. Will await CT imaging of liver as well.   He agreed with the plan, all questions answered.   Keo Cellar, MD [facility] Pager [phone]  CC: Shirline Frees, MD

## 2015-06-10 ENCOUNTER — Telehealth: Payer: Self-pay | Admitting: Gastroenterology

## 2015-06-10 NOTE — Telephone Encounter (Signed)
I left a message that the labs are not available from Dr. Havery Moros at this time and that we will call when they have been reveiwed

## 2015-06-14 LAB — MITOCHONDRIAL ANTIBODIES: Mitochondrial M2 Ab, IgG: <=20 Units (ref <=20.0)

## 2015-06-15 ENCOUNTER — Telehealth: Payer: Self-pay | Admitting: Gastroenterology

## 2015-06-15 NOTE — Telephone Encounter (Signed)
Left a message for patient's wife to call back. 

## 2015-06-16 ENCOUNTER — Other Ambulatory Visit: Payer: Self-pay | Admitting: *Deleted

## 2015-06-16 ENCOUNTER — Encounter: Payer: Self-pay | Admitting: *Deleted

## 2015-06-16 DIAGNOSIS — R748 Abnormal levels of other serum enzymes: Secondary | ICD-10-CM

## 2015-06-16 DIAGNOSIS — R1030 Lower abdominal pain, unspecified: Secondary | ICD-10-CM

## 2015-06-16 DIAGNOSIS — N289 Disorder of kidney and ureter, unspecified: Secondary | ICD-10-CM

## 2015-06-16 HISTORY — PX: REPLACEMENT TOTAL KNEE: SUR1224

## 2015-06-16 NOTE — Telephone Encounter (Signed)
See results note. 

## 2015-06-21 ENCOUNTER — Ambulatory Visit (INDEPENDENT_AMBULATORY_CARE_PROVIDER_SITE_OTHER)
Admission: RE | Admit: 2015-06-21 | Discharge: 2015-06-21 | Disposition: A | Discharge status: Home / Self Care | Payer: Medicare Other | Source: Ambulatory Visit | Attending: Gastroenterology | Admitting: Gastroenterology

## 2015-06-21 DIAGNOSIS — N289 Disorder of kidney and ureter, unspecified: Secondary | ICD-10-CM

## 2015-06-21 DIAGNOSIS — R748 Abnormal levels of other serum enzymes: Secondary | ICD-10-CM

## 2015-06-21 DIAGNOSIS — R1030 Lower abdominal pain, unspecified: Secondary | ICD-10-CM | POA: Diagnosis not present

## 2015-06-21 MED ORDER — IOPAMIDOL (ISOVUE-300) INJECTION 61%
80.0000 mL | Freq: Once | INTRAVENOUS | Status: AC | PRN
  Administered 2015-06-21: 80 mL via INTRAVENOUS

## 2015-06-22 ENCOUNTER — Telehealth: Payer: Self-pay | Admitting: Gastroenterology

## 2015-06-22 ENCOUNTER — Other Ambulatory Visit: Payer: Self-pay | Admitting: *Deleted

## 2015-06-22 DIAGNOSIS — R748 Abnormal levels of other serum enzymes: Secondary | ICD-10-CM

## 2015-06-22 DIAGNOSIS — R9389 Abnormal findings on diagnostic imaging of other specified body structures: Secondary | ICD-10-CM

## 2015-06-22 NOTE — Telephone Encounter (Signed)
Called and spoke with the patient's wife, the patient was unavailable. I discussed all the findings on the CT. 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 This should not delay his surgery as scheduled. Rollene Fare, they are asking to see if it can be done before next WEd, the date of his surgery, however I told them not sure if they are any openings and it does not have to be done before the surgery. Can you look into this and let them know?  Otherwise, recommend checking his kidney function next week prior to his leg surgery to ensure stable after the CT contrast. Can you please add the BMET to the additional liver labs we spoke about? Thanks for your help with all of this

## 2015-06-22 NOTE — Telephone Encounter (Signed)
Patient calling for CT results. Please, advise. 

## 2015-06-22 NOTE — Telephone Encounter (Signed)
Spoke with Nicole Kindred in scheduling and moved MRI/MRCP to 06/27/15 at 9:00 PM. NPO 4 hours prior. Patient aware.

## 2015-06-22 NOTE — Telephone Encounter (Signed)
See results note. 

## 2015-06-22 NOTE — Telephone Encounter (Signed)
Can you please relay results, I have sent to you in results message. Thanks

## 2015-06-27 ENCOUNTER — Ambulatory Visit (HOSPITAL_COMMUNITY)
Admission: RE | Admit: 2015-06-27 | Discharge: 2015-06-27 | Disposition: A | Discharge status: Home / Self Care | Payer: Medicare Other | Source: Ambulatory Visit | Attending: Gastroenterology | Admitting: Gastroenterology

## 2015-06-27 ENCOUNTER — Other Ambulatory Visit (INDEPENDENT_AMBULATORY_CARE_PROVIDER_SITE_OTHER): Payer: Medicare Other

## 2015-06-27 ENCOUNTER — Other Ambulatory Visit: Payer: Self-pay | Admitting: Gastroenterology

## 2015-06-27 DIAGNOSIS — R938 Abnormal findings on diagnostic imaging of other specified body structures: Secondary | ICD-10-CM

## 2015-06-27 DIAGNOSIS — K760 Fatty (change of) liver, not elsewhere classified: Secondary | ICD-10-CM | POA: Insufficient documentation

## 2015-06-27 DIAGNOSIS — D1803 Hemangioma of intra-abdominal structures: Secondary | ICD-10-CM | POA: Diagnosis not present

## 2015-06-27 DIAGNOSIS — R748 Abnormal levels of other serum enzymes: Secondary | ICD-10-CM

## 2015-06-27 DIAGNOSIS — R9389 Abnormal findings on diagnostic imaging of other specified body structures: Secondary | ICD-10-CM

## 2015-06-27 DIAGNOSIS — R935 Abnormal findings on diagnostic imaging of other abdominal regions, including retroperitoneum: Secondary | ICD-10-CM | POA: Diagnosis not present

## 2015-06-27 LAB — BASIC METABOLIC PANEL
BUN: 24 mg/dL — AB (ref 6–23)
CHLORIDE: 108 meq/L (ref 96–112)
CO2: 29 meq/L (ref 19–32)
Calcium: 9.7 mg/dL (ref 8.4–10.5)
Creatinine, Ser: 1.52 mg/dL — ABNORMAL HIGH (ref 0.40–1.50)
GFR: 48.6 mL/min — ABNORMAL LOW (ref >60.00)
GLUCOSE: 96 mg/dL (ref 70–99)
POTASSIUM: 4.8 meq/L (ref 3.5–5.1)
SODIUM: 144 meq/L (ref 135–145)

## 2015-06-27 LAB — IRON AND TIBC
%SAT: 37 % (ref 15–60)
Iron: 118 ug/dL (ref 50–180)
TIBC: 321 ug/dL (ref 250–425)
UIBC: 203 ug/dL (ref 125–400)

## 2015-06-27 LAB — FERRITIN: FERRITIN: 238.2 ng/mL (ref 22.0–322.0)

## 2015-06-27 MED ORDER — GADOBENATE DIMEGLUMINE 529 MG/ML IV SOLN
20.0000 mL | Freq: Once | INTRAVENOUS | Status: AC | PRN
  Administered 2015-06-27: 20 mL via INTRAVENOUS

## 2015-06-28 ENCOUNTER — Telehealth: Payer: Self-pay | Admitting: Gastroenterology

## 2015-06-28 LAB — ANA: ANA: NEGATIVE

## 2015-06-28 LAB — IGG 4: IgG, Subclass 4: 16 mg/dL (ref 2–96)

## 2015-06-28 LAB — IGG: IGG (IMMUNOGLOBIN G), SERUM: 957 mg/dL (ref 694–1618)

## 2015-06-28 NOTE — Telephone Encounter (Signed)
Patient given results

## 2015-06-28 NOTE — Telephone Encounter (Signed)
Patient is scheduled for surgery tomorrow and is asking for MRI results.

## 2015-06-28 NOTE — Telephone Encounter (Signed)
MRI liver shows benign hemangioma, normal biliary tree. No mass lesion. Hepatic steatosis noted. I don't see any pathology to cause the elevated AP on this exam. He's okay to proceed with surgery at this time.

## 2015-06-29 DIAGNOSIS — E669 Obesity, unspecified: Secondary | ICD-10-CM | POA: Diagnosis present

## 2015-06-29 DIAGNOSIS — Z880 Allergy status to penicillin: Secondary | ICD-10-CM | POA: Diagnosis not present

## 2015-06-29 DIAGNOSIS — K219 Gastro-esophageal reflux disease without esophagitis: Secondary | ICD-10-CM | POA: Diagnosis present

## 2015-06-29 DIAGNOSIS — E039 Hypothyroidism, unspecified: Secondary | ICD-10-CM | POA: Diagnosis present

## 2015-06-29 DIAGNOSIS — M1711 Unilateral primary osteoarthritis, right knee: Secondary | ICD-10-CM | POA: Insufficient documentation

## 2015-06-29 DIAGNOSIS — Z6837 Body mass index (BMI) 37.0-37.9, adult: Secondary | ICD-10-CM | POA: Diagnosis not present

## 2015-06-29 DIAGNOSIS — Z79899 Other long term (current) drug therapy: Secondary | ICD-10-CM | POA: Diagnosis not present

## 2015-06-29 HISTORY — DX: Unilateral primary osteoarthritis, right knee: M17.11

## 2015-06-30 ENCOUNTER — Ambulatory Visit (HOSPITAL_COMMUNITY): Payer: Medicare Other

## 2015-06-30 DIAGNOSIS — Z96651 Presence of right artificial knee joint: Secondary | ICD-10-CM

## 2015-06-30 HISTORY — DX: Presence of right artificial knee joint: Z96.651

## 2015-06-30 LAB — ANTI-SMOOTH MUSCLE ANTIBODY, IGG: Smooth Muscle Ab: <20 U (ref <20)

## 2015-07-02 DIAGNOSIS — N9989 Other postprocedural complications and disorders of genitourinary system: Secondary | ICD-10-CM | POA: Diagnosis not present

## 2015-07-02 DIAGNOSIS — Z79899 Other long term (current) drug therapy: Secondary | ICD-10-CM | POA: Diagnosis not present

## 2015-07-02 DIAGNOSIS — Z7982 Long term (current) use of aspirin: Secondary | ICD-10-CM | POA: Diagnosis not present

## 2015-07-02 DIAGNOSIS — Z87442 Personal history of urinary calculi: Secondary | ICD-10-CM | POA: Diagnosis not present

## 2015-07-02 DIAGNOSIS — Z880 Allergy status to penicillin: Secondary | ICD-10-CM | POA: Diagnosis not present

## 2015-07-02 DIAGNOSIS — Z471 Aftercare following joint replacement surgery: Secondary | ICD-10-CM | POA: Diagnosis not present

## 2015-07-02 DIAGNOSIS — R338 Other retention of urine: Secondary | ICD-10-CM | POA: Diagnosis not present

## 2015-07-02 DIAGNOSIS — E079 Disorder of thyroid, unspecified: Secondary | ICD-10-CM | POA: Diagnosis not present

## 2015-07-02 DIAGNOSIS — R3911 Hesitancy of micturition: Secondary | ICD-10-CM | POA: Diagnosis not present

## 2015-07-02 DIAGNOSIS — Z96651 Presence of right artificial knee joint: Secondary | ICD-10-CM | POA: Diagnosis not present

## 2015-07-02 DIAGNOSIS — R110 Nausea: Secondary | ICD-10-CM | POA: Diagnosis not present

## 2015-07-02 DIAGNOSIS — K219 Gastro-esophageal reflux disease without esophagitis: Secondary | ICD-10-CM | POA: Diagnosis not present

## 2015-07-02 DIAGNOSIS — R1030 Lower abdominal pain, unspecified: Secondary | ICD-10-CM | POA: Diagnosis not present

## 2015-07-02 DIAGNOSIS — R339 Retention of urine, unspecified: Secondary | ICD-10-CM | POA: Diagnosis not present

## 2015-07-05 ENCOUNTER — Other Ambulatory Visit: Payer: Self-pay | Admitting: *Deleted

## 2015-07-05 DIAGNOSIS — Z96651 Presence of right artificial knee joint: Secondary | ICD-10-CM | POA: Diagnosis not present

## 2015-07-05 DIAGNOSIS — R945 Abnormal results of liver function studies: Secondary | ICD-10-CM

## 2015-07-05 DIAGNOSIS — R339 Retention of urine, unspecified: Secondary | ICD-10-CM | POA: Diagnosis not present

## 2015-07-05 DIAGNOSIS — Z471 Aftercare following joint replacement surgery: Secondary | ICD-10-CM | POA: Diagnosis not present

## 2015-07-05 DIAGNOSIS — R7989 Other specified abnormal findings of blood chemistry: Secondary | ICD-10-CM

## 2015-07-05 DIAGNOSIS — K219 Gastro-esophageal reflux disease without esophagitis: Secondary | ICD-10-CM | POA: Diagnosis not present

## 2015-07-06 DIAGNOSIS — Z6835 Body mass index (BMI) 35.0-35.9, adult: Secondary | ICD-10-CM | POA: Diagnosis not present

## 2015-07-06 DIAGNOSIS — R338 Other retention of urine: Secondary | ICD-10-CM | POA: Diagnosis not present

## 2015-07-06 DIAGNOSIS — N401 Enlarged prostate with lower urinary tract symptoms: Secondary | ICD-10-CM | POA: Diagnosis not present

## 2015-07-07 DIAGNOSIS — Z471 Aftercare following joint replacement surgery: Secondary | ICD-10-CM | POA: Diagnosis not present

## 2015-07-07 DIAGNOSIS — R339 Retention of urine, unspecified: Secondary | ICD-10-CM | POA: Diagnosis not present

## 2015-07-07 DIAGNOSIS — K219 Gastro-esophageal reflux disease without esophagitis: Secondary | ICD-10-CM | POA: Diagnosis not present

## 2015-07-07 DIAGNOSIS — Z96651 Presence of right artificial knee joint: Secondary | ICD-10-CM | POA: Diagnosis not present

## 2015-07-11 DIAGNOSIS — Z6835 Body mass index (BMI) 35.0-35.9, adult: Secondary | ICD-10-CM | POA: Diagnosis not present

## 2015-07-11 DIAGNOSIS — Z96651 Presence of right artificial knee joint: Secondary | ICD-10-CM | POA: Diagnosis not present

## 2015-07-11 DIAGNOSIS — R339 Retention of urine, unspecified: Secondary | ICD-10-CM | POA: Diagnosis not present

## 2015-07-11 DIAGNOSIS — Z471 Aftercare following joint replacement surgery: Secondary | ICD-10-CM | POA: Diagnosis not present

## 2015-07-11 DIAGNOSIS — K219 Gastro-esophageal reflux disease without esophagitis: Secondary | ICD-10-CM | POA: Diagnosis not present

## 2015-07-12 DIAGNOSIS — R339 Retention of urine, unspecified: Secondary | ICD-10-CM | POA: Diagnosis not present

## 2015-07-12 DIAGNOSIS — Z96651 Presence of right artificial knee joint: Secondary | ICD-10-CM | POA: Diagnosis not present

## 2015-07-12 DIAGNOSIS — Z6835 Body mass index (BMI) 35.0-35.9, adult: Secondary | ICD-10-CM | POA: Diagnosis not present

## 2015-07-14 DIAGNOSIS — R339 Retention of urine, unspecified: Secondary | ICD-10-CM | POA: Diagnosis not present

## 2015-07-14 DIAGNOSIS — Z96651 Presence of right artificial knee joint: Secondary | ICD-10-CM | POA: Diagnosis not present

## 2015-07-14 DIAGNOSIS — Z471 Aftercare following joint replacement surgery: Secondary | ICD-10-CM | POA: Diagnosis not present

## 2015-07-14 DIAGNOSIS — K219 Gastro-esophageal reflux disease without esophagitis: Secondary | ICD-10-CM | POA: Diagnosis not present

## 2015-07-15 DIAGNOSIS — Z6835 Body mass index (BMI) 35.0-35.9, adult: Secondary | ICD-10-CM | POA: Diagnosis not present

## 2015-07-15 DIAGNOSIS — R339 Retention of urine, unspecified: Secondary | ICD-10-CM | POA: Diagnosis not present

## 2015-07-20 DIAGNOSIS — K219 Gastro-esophageal reflux disease without esophagitis: Secondary | ICD-10-CM | POA: Diagnosis not present

## 2015-07-20 DIAGNOSIS — R339 Retention of urine, unspecified: Secondary | ICD-10-CM | POA: Diagnosis not present

## 2015-07-20 DIAGNOSIS — Z96651 Presence of right artificial knee joint: Secondary | ICD-10-CM | POA: Diagnosis not present

## 2015-07-20 DIAGNOSIS — Z471 Aftercare following joint replacement surgery: Secondary | ICD-10-CM | POA: Diagnosis not present

## 2015-07-21 DIAGNOSIS — R338 Other retention of urine: Secondary | ICD-10-CM | POA: Diagnosis not present

## 2015-07-21 DIAGNOSIS — N401 Enlarged prostate with lower urinary tract symptoms: Secondary | ICD-10-CM | POA: Diagnosis not present

## 2015-07-21 DIAGNOSIS — Z6835 Body mass index (BMI) 35.0-35.9, adult: Secondary | ICD-10-CM | POA: Diagnosis not present

## 2015-07-22 DIAGNOSIS — R339 Retention of urine, unspecified: Secondary | ICD-10-CM | POA: Diagnosis not present

## 2015-07-22 DIAGNOSIS — Z471 Aftercare following joint replacement surgery: Secondary | ICD-10-CM | POA: Diagnosis not present

## 2015-07-22 DIAGNOSIS — Z96651 Presence of right artificial knee joint: Secondary | ICD-10-CM | POA: Diagnosis not present

## 2015-07-22 DIAGNOSIS — K219 Gastro-esophageal reflux disease without esophagitis: Secondary | ICD-10-CM | POA: Diagnosis not present

## 2015-07-25 ENCOUNTER — Encounter: Payer: Self-pay | Admitting: Physical Therapy

## 2015-07-25 ENCOUNTER — Ambulatory Visit: Payer: Medicare Other | Attending: Orthopaedic Surgery | Admitting: Physical Therapy

## 2015-07-25 DIAGNOSIS — M25661 Stiffness of right knee, not elsewhere classified: Secondary | ICD-10-CM | POA: Diagnosis not present

## 2015-07-25 DIAGNOSIS — R262 Difficulty in walking, not elsewhere classified: Secondary | ICD-10-CM | POA: Diagnosis not present

## 2015-07-25 DIAGNOSIS — R2241 Localized swelling, mass and lump, right lower limb: Secondary | ICD-10-CM | POA: Diagnosis not present

## 2015-07-25 DIAGNOSIS — M25561 Pain in right knee: Secondary | ICD-10-CM | POA: Insufficient documentation

## 2015-07-25 NOTE — Therapy (Signed)
[address] [place], [name], [address] Phone: [phone]   Fax:  [phone]  Physical Therapy Evaluation  Patient Details  Name: Mario Stevenson MRN: [id number] Date of Birth: 29-Jul-1945 Referring Provider: Michaelle Birks  Encounter Date: 07/25/2015      PT End of Session - 07/25/15 1652    Visit Number 1   Date for PT Re-Evaluation 09/25/15   PT Start Time [id number]   PT Stop Time 1710   PT Time Calculation (min) 54 min   Activity Tolerance Patient tolerated treatment well;Patient limited by pain   Behavior During Therapy [facility] for tasks assessed/performed      Past Medical History  Diagnosis Date  . GERD (gastroesophageal reflux disease)   . Esophageal stricture   . Hiatal hernia   . Hyperlipidemia   . Hypothyroidism   . Hyperplastic colon polyp     Past Surgical History  Procedure Laterality Date  . Achilles tendon repair      right    There were no vitals filed for this visit.       Subjective Assessment - 07/25/15 1620    Subjective Patient reports that he underwent a right TKR on 06/29/15.  He reports that he was having pain for about 2 years.  He reports that he has had some urinary complications that have set him back in his progress.   Limitations Standing;Walking;House hold activities   Patient Stated Goals have no pain, better ROM and no difficulty walking.    Currently in Pain? Yes   Pain Score 3    Pain Location Knee   Pain Orientation Right   Pain Descriptors / Indicators Aching;Throbbing   Pain Type Surgical pain   Pain Onset 1 to 4 weeks ago   Pain Frequency Constant   Aggravating Factors  stretching it, walking and bein up on it increases the pain   Pain Relieving Factors rest and elevation   Effect of Pain on Daily Activities limits all ADL's            [facility] PT Assessment - 07/25/15 0001    Assessment   Medical Diagnosis s/p right TKR   Referring Provider Bashore    Onset Date/Surgical Date 06/29/15   Prior Therapy at home over the past 2 weeks   Precautions   Precautions None   Balance Screen   Has the patient fallen in the past 6 months No   Has the patient had a decrease in activity level because of a fear of falling?  No   Is the patient reluctant to leave their home because of a fear of falling?  No   Home Environment   Additional Comments has stairs, did his own housework and yardwork   Prior Function   Level of Independence Independent   Vocation Retired   Leisure did some exercise with bands at home   Observation/Other Assessments-Edema    Edema Circumferential   Circumferential Edema   Circumferential - Right 52 cm   Circumferential - Left  45 cm   ROM / Strength   AROM / PROM / Strength AROM;PROM;Strength   AROM   AROM Assessment Site Knee   Right/Left Knee Right   Right Knee Extension 23   Right Knee Flexion 80   PROM   Overall PROM Comments very stiff end feel   PROM Assessment Site Knee   Right/Left Knee Right   Right Knee Extension 6   Right Knee Flexion  82   Strength   Overall Strength Comments 3+/5 flexion and extension, flexion increased HS pain   Palpation   Palpation comment significant swelling of the right knee , ballotable patella, steristrips in place, quad lag   Ambulation/Gait   Gait Comments with SPC, slow, mildly stiff knee, but otherwise prettys smooth, stairs are one at a time                   [facility] Adult PT Treatment/Exercise - 07/25/15 0001    Exercises   Exercises Knee/Hip   Knee/Hip Exercises: Aerobic   Nustep level 4 x 6 minutes  3 rest breaks   Knee/Hip Exercises: Machines for Strengthening   Cybex Leg Press 20# and no weight working on him bending the knee   Modalities   Modalities Vasopneumatic   Vasopneumatic   Number Minutes Vasopneumatic  15 minutes   Vasopnuematic Location  Knee   Vasopneumatic Pressure Medium   Vasopneumatic Temperature  36                   PT Short Term Goals - 07/25/15 1657    PT SHORT TERM GOAL #1   Title understand RICE for home   Time 2   Period Weeks   Status New           PT Long Term Goals - 07/25/15 1657    PT LONG TERM GOAL #1   Title independent with advanced HEP   Time 8   Period Weeks   Status New   PT LONG TERM GOAL #2   Title increase AROM to 5-110 degrees flexion   Time 8   Period Weeks   Status New   PT LONG TERM GOAL #3   Title ambulate all distances without assistive device   Time 8   Period Weeks   Status New   PT LONG TERM GOAL #4   Title ascend and descend stairs step over step   Time 8   Period Weeks   Status New   PT LONG TERM GOAL #5   Title increase strength to 4+/5   Time 8   Period Weeks   Status New   Additional Long Term Goals   Additional Long Term Goals Yes   PT LONG TERM GOAL #6   Title decrease pain iwth knee flexion 50%   Time 8   Period Weeks   Status New               Plan - 07/25/15 1654    Clinical Impression Statement Patient underwent a right TKR on 0000000, he had complications of urinary origin and this has held him back in ROM.  He presents today with AROM of 23-80 degrees flexion, with pain anterior knee with flexion, PROM was to 82 degrees flexion with hard end feel   Rehab Potential Good   PT Frequency 3x / week   PT Duration 4 weeks   PT Treatment/Interventions ADLs/Self Care Home Management;Cryotherapy;Electrical Stimulation;Moist Heat;Therapeutic exercise;Therapeutic activities;Gait training;Stair training;Functional mobility training;Manual techniques;Passive range of motion;Patient/family education   PT Next Visit Plan slowly add exercises, PROM, manual techniques to gain ROM   Consulted and Agree with Plan of Care Patient      Patient will benefit from skilled therapeutic intervention in order to improve the following deficits and impairments:  Abnormal gait, Decreased activity tolerance, Decreased mobility, Decreased range of  motion, Difficulty walking, Decreased scar mobility, Decreased strength, Increased edema, Impaired flexibility, Pain  Visit Diagnosis:  Pain in right knee - Plan: PT plan of care cert/re-cert  Stiffness of right knee, not elsewhere classified - Plan: PT plan of care cert/re-cert  Difficulty in walking, not elsewhere classified - Plan: PT plan of care cert/re-cert  Localized swelling, mass and lump, right lower limb - Plan: PT plan of care cert/re-cert      G-Codes - [id number]    Functional Assessment Tool Used foto 69% limitation   Functional Limitation Mobility: Walking and moving around   Mobility: Walking and Moving Around Current Status [id number]) At least 60 percent but less than 80 percent impaired, limited or restricted   Mobility: Walking and Moving Around Goal Status [phone]) At least 40 percent but less than 60 percent impaired, limited or restricted       Problem List Patient Active Problem List   Diagnosis Date Noted  . ESOPHAGEAL STRICTURE 02/27/2010  . HIATAL HERNIA 02/27/2010  . GYNECOMASTIA, UNILATERAL 12/15/2009  . HYPOTHYROIDISM 12/06/2009  . HYPERLIPIDEMIA 12/06/2009  . GERD 12/06/2009  . ABDOMINAL PAIN, EPIGASTRIC 12/06/2009  . COLONIC POLYPS, HYPERPLASTIC, HX OF 12/06/2009    Sumner Boast., PT 07/25/2015, 5:08 PM  [address] [place], [name], [address] Phone: [phone]   Fax:  [phone]  Name: Mario Stevenson MRN: [id number] Date of Birth: September 25, 1945

## 2015-07-26 ENCOUNTER — Ambulatory Visit: Payer: Medicare Other | Admitting: Physical Therapy

## 2015-07-26 ENCOUNTER — Encounter: Payer: Self-pay | Admitting: Physical Therapy

## 2015-07-26 DIAGNOSIS — R2241 Localized swelling, mass and lump, right lower limb: Secondary | ICD-10-CM

## 2015-07-26 DIAGNOSIS — M25561 Pain in right knee: Secondary | ICD-10-CM | POA: Diagnosis not present

## 2015-07-26 DIAGNOSIS — M25661 Stiffness of right knee, not elsewhere classified: Secondary | ICD-10-CM

## 2015-07-26 DIAGNOSIS — R262 Difficulty in walking, not elsewhere classified: Secondary | ICD-10-CM

## 2015-07-26 NOTE — Therapy (Signed)
[facility] [place], [name], [address] Phone: [phone]   Fax:  [phone]  Physical Therapy Treatment  Patient Details  Name: Mario Stevenson MRN: [id number] Date of Birth: May 30, 1945 Referring Provider: Michaelle Birks  Encounter Date: 07/26/2015      PT End of Session - 07/26/15 1355    Visit Number 2   Date for PT Re-Evaluation 09/25/15   PT Start Time 1315   PT Stop Time 1415   PT Time Calculation (min) 60 min      Past Medical History  Diagnosis Date  . GERD (gastroesophageal reflux disease)   . Esophageal stricture   . Hiatal hernia   . Hyperlipidemia   . Hypothyroidism   . Hyperplastic colon polyp     Past Surgical History  Procedure Laterality Date  . Achilles tendon repair      right    There were no vitals filed for this visit.      Subjective Assessment - 07/26/15 1341    Subjective sore but not as swollen today   Currently in Pain? Yes   Pain Score 5    Pain Location Knee   Pain Orientation Right                         [facility] Adult PT Treatment/Exercise - 07/26/15 0001    Knee/Hip Exercises: Aerobic   Recumbent Bike 6 min, some full rev.   Nustep level 4 x 6 minutes   Knee/Hip Exercises: Machines for Strengthening   Cybex Knee Extension 5# 3 sets 5  asistance to keep thigh down   Cybex Knee Flexion 15# 2 sets 10   Cybex Leg Press 20# 3 sets 10   Knee/Hip Exercises: Standing   Forward Step Up Right;2 sets;10 reps;Hand Hold: 2;Step Height: 4"  2nd set 6 inch   Knee/Hip Exercises: Seated   Other Seated Knee/Hip Exercises TKE green tband 2 sets 10   Modalities   Modalities Vasopneumatic;Electrical Stimulation   Electrical Stimulation   Electrical Stimulation Location RT knee   Electrical Stimulation Action IFC   Electrical Stimulation Goals Pain;Edema   Vasopneumatic   Number Minutes Vasopneumatic  15 minutes   Vasopnuematic Location  Knee   Vasopneumatic Pressure Medium   Vasopneumatic Temperature  36   Manual Therapy   Manual Therapy Passive ROM   Manual therapy comments belt mobs flex and ext   Passive ROM 98 after MT                  PT Short Term Goals - 07/26/15 1357    PT SHORT TERM GOAL #1   Title understand RICE for home   Status Achieved           PT Long Term Goals - 07/25/15 1657    PT LONG TERM GOAL #1   Title independent with advanced HEP   Time 8   Period Weeks   Status New   PT LONG TERM GOAL #2   Title increase AROM to 5-110 degrees flexion   Time 8   Period Weeks   Status New   PT LONG TERM GOAL #3   Title ambulate all distances without assistive device   Time 8   Period Weeks   Status New   PT LONG TERM GOAL #4   Title ascend and descend stairs step over step   Time 8   Period Weeks   Status  New   PT LONG TERM GOAL #5   Title increase strength to 4+/5   Time 8   Period Weeks   Status New   Additional Long Term Goals   Additional Long Term Goals Yes   PT LONG TERM GOAL #6   Title decrease pain iwth knee flexion 50%   Time 8   Period Weeks   Status New               Plan - 07/26/15 1356    Clinical Impression Statement pt with increased ROm with max cuing and encouragemnet , pt is limited by pain.   PT Next Visit Plan slowly add exercises, PROM, manual techniques to gain ROM      Patient will benefit from skilled therapeutic intervention in order to improve the following deficits and impairments:  Abnormal gait, Decreased activity tolerance, Decreased mobility, Decreased range of motion, Difficulty walking, Decreased scar mobility, Decreased strength, Increased edema, Impaired flexibility, Pain  Visit Diagnosis: Pain in right knee  Stiffness of right knee, not elsewhere classified  Difficulty in walking, not elsewhere classified  Localized swelling, mass and lump, right lower limb       G-Codes - Aug 17, 2015 1700    Functional Assessment Tool Used  foto 69% limitation   Functional Limitation Mobility: Walking and moving around   Mobility: Walking and Moving Around Current Status [phone]) At least 60 percent but less than 80 percent impaired, limited or restricted   Mobility: Walking and Moving Around Goal Status [phone]) At least 40 percent but less than 60 percent impaired, limited or restricted      Problem List Patient Active Problem List   Diagnosis Date Noted  . ESOPHAGEAL STRICTURE 02/27/2010  . HIATAL HERNIA 02/27/2010  . GYNECOMASTIA, UNILATERAL 12/15/2009  . HYPOTHYROIDISM 12/06/2009  . HYPERLIPIDEMIA 12/06/2009  . GERD 12/06/2009  . ABDOMINAL PAIN, EPIGASTRIC 12/06/2009  . COLONIC POLYPS, HYPERPLASTIC, HX OF 12/06/2009    Mario Stevenson,[name] PTA 07/26/2015, 1:57 PM  [facility] [place] [address] [place], [name], [address] Phone: [phone]   Fax:  [phone]  Name: Mario Stevenson MRN: [id number] Date of Birth: Nov 13, 1945

## 2015-07-28 ENCOUNTER — Ambulatory Visit: Payer: Medicare Other | Admitting: Physical Therapy

## 2015-07-28 ENCOUNTER — Encounter: Payer: Self-pay | Admitting: Physical Therapy

## 2015-07-28 DIAGNOSIS — R262 Difficulty in walking, not elsewhere classified: Secondary | ICD-10-CM | POA: Diagnosis not present

## 2015-07-28 DIAGNOSIS — N390 Urinary tract infection, site not specified: Secondary | ICD-10-CM | POA: Diagnosis not present

## 2015-07-28 DIAGNOSIS — M25561 Pain in right knee: Secondary | ICD-10-CM

## 2015-07-28 DIAGNOSIS — M25661 Stiffness of right knee, not elsewhere classified: Secondary | ICD-10-CM

## 2015-07-28 DIAGNOSIS — R338 Other retention of urine: Secondary | ICD-10-CM | POA: Diagnosis not present

## 2015-07-28 DIAGNOSIS — R2241 Localized swelling, mass and lump, right lower limb: Secondary | ICD-10-CM

## 2015-07-28 DIAGNOSIS — N401 Enlarged prostate with lower urinary tract symptoms: Secondary | ICD-10-CM | POA: Diagnosis not present

## 2015-07-28 DIAGNOSIS — T83511D Infection and inflammatory reaction due to indwelling urethral catheter, subsequent encounter: Secondary | ICD-10-CM | POA: Diagnosis not present

## 2015-07-28 NOTE — Therapy (Signed)
Mario Stevenson, [name], [address] Phone: [phone]   Fax:  [phone]  Physical Therapy Treatment  Patient Details  Name: Mario Stevenson MRN: [id number] Date of Birth: 12-26-1945 Referring Provider: Michaelle Birks  Encounter Date: 07/28/2015      PT End of Session - 07/28/15 1108    Visit Number 3   Date for PT Re-Evaluation 09/25/15   PT Start Time 1020   PT Stop Time 1122   PT Time Calculation (min) 62 min      Past Medical History  Diagnosis Date  . GERD (gastroesophageal reflux disease)   . Esophageal stricture   . Hiatal hernia   . Hyperlipidemia   . Hypothyroidism   . Hyperplastic colon polyp     Past Surgical History  Procedure Laterality Date  . Achilles tendon repair      right    There were no vitals filed for this visit.      Subjective Assessment - 07/28/15 1023    Subjective sore after last session, minimal pain at rest   Currently in Pain? Yes   Pain Score 5    Pain Location Knee   Pain Orientation Right                         [facility] Adult PT Treatment/Exercise - 07/28/15 0001    Knee/Hip Exercises: Aerobic   Elliptical 2 fwd/2 back R4 I 10  for improved ROM with gait as pt is still missing flexion in   Nustep level 4 x 6 minutes   Knee/Hip Exercises: Machines for Strengthening   Cybex Knee Extension 5# up with 2 down with RT 2 sets 10  5# RT only 2 sets 5   Cybex Knee Flexion 15# 2 sets 10  left only 2 sets . 1 set both legs   Cybex Leg Press 20# 3 sets 10  calf raises 20# 2 set10   Knee/Hip Exercises: Standing   Lateral Step Up Right;1 set;15 reps;Step Height: 6"   Forward Step Up Right;1 set;15 reps;Hand Hold: 2;Step Height: 6"   Acupuncturist Location RT knee   Electrical Stimulation Action IFC   Electrical Stimulation Goals Pain;Edema   Vasopneumatic   Number Minutes Vasopneumatic  15 minutes   Vasopnuematic Location  Knee   Vasopneumatic Pressure Medium   Manual Therapy   Manual Therapy Passive ROM   Manual therapy comments belt mobs flex and ext   Passive ROM 100 after belt mobs                  PT Short Term Goals - 07/26/15 1357    PT SHORT TERM GOAL #1   Title understand RICE for home   Status Achieved           PT Long Term Goals - 07/25/15 1657    PT LONG TERM GOAL #1   Title independent with advanced HEP   Time 8   Period Weeks   Status New   PT LONG TERM GOAL #2   Title increase AROM to 5-110 degrees flexion   Time 8   Period Weeks   Status New   PT LONG TERM GOAL #3   Title ambulate all distances without assistive device   Time 8   Period Weeks   Status New   PT LONG TERM GOAL #4   Title ascend and descend stairs step  over step   Time 8   Period Weeks   Status New   PT LONG TERM GOAL #5   Title increase strength to 4+/5   Time 8   Period Weeks   Status New   Additional Long Term Goals   Additional Long Term Goals Yes   PT LONG TERM GOAL #6   Title decrease pain iwth knee flexion 50%   Time 8   Period Weeks   Status New               Plan - 07/28/15 1108    Clinical Impression Statement improved func ROm with ther ex. Still guarded and pain limited with PROM. Quad weakness.   PT Next Visit Plan ROM, increase gait. strengthening      Patient will benefit from skilled therapeutic intervention in order to improve the following deficits and impairments:  Abnormal gait, Decreased activity tolerance, Decreased mobility, Decreased range of motion, Difficulty walking, Decreased scar mobility, Decreased strength, Increased edema, Impaired flexibility, Pain  Visit Diagnosis: Pain in right knee  Stiffness of right knee, not elsewhere classified  Difficulty in walking, not elsewhere classified  Localized swelling, mass and lump, right lower limb     Problem List Patient Active Problem List   Diagnosis Date Noted  .  ESOPHAGEAL STRICTURE 02/27/2010  . HIATAL HERNIA 02/27/2010  . GYNECOMASTIA, UNILATERAL 12/15/2009  . HYPOTHYROIDISM 12/06/2009  . HYPERLIPIDEMIA 12/06/2009  . GERD 12/06/2009  . ABDOMINAL PAIN, EPIGASTRIC 12/06/2009  . COLONIC POLYPS, HYPERPLASTIC, HX OF 12/06/2009    Mario Stevenson,Mario Stevenson PTA 07/28/2015, 11:10 AM  [name], [name], [address] Phone: [phone]   Fax:  [phone]  Name: Mario Stevenson MRN: [id number] Date of Birth: 07-11-1945

## 2015-08-02 ENCOUNTER — Encounter: Payer: Self-pay | Admitting: Physical Therapy

## 2015-08-02 ENCOUNTER — Ambulatory Visit: Payer: Medicare Other | Admitting: Physical Therapy

## 2015-08-02 DIAGNOSIS — R262 Difficulty in walking, not elsewhere classified: Secondary | ICD-10-CM | POA: Diagnosis not present

## 2015-08-02 DIAGNOSIS — R2241 Localized swelling, mass and lump, right lower limb: Secondary | ICD-10-CM | POA: Diagnosis not present

## 2015-08-02 DIAGNOSIS — M25561 Pain in right knee: Secondary | ICD-10-CM | POA: Diagnosis not present

## 2015-08-02 DIAGNOSIS — M25661 Stiffness of right knee, not elsewhere classified: Secondary | ICD-10-CM | POA: Diagnosis not present

## 2015-08-02 NOTE — Therapy (Signed)
[place] [address] [place], [name], [address] Phone: [phone]   Fax:  [phone]  Physical Therapy Treatment  Patient Details  Name: Mario Stevenson MRN: [id number] Date of Birth: 1946-01-05 Referring Provider: Michaelle Birks  Encounter Date: 08/02/2015      PT End of Session - 08/02/15 1458    Visit Number 4   Date for PT Re-Evaluation 09/25/15   PT Start Time [id number]   PT Stop Time 1450   PT Time Calculation (min) 55 min      Past Medical History  Diagnosis Date  . GERD (gastroesophageal reflux disease)   . Esophageal stricture   . Hiatal hernia   . Hyperlipidemia   . Hypothyroidism   . Hyperplastic colon polyp     Past Surgical History  Procedure Laterality Date  . Achilles tendon repair      right    There were no vitals filed for this visit.      Subjective Assessment - 08/02/15 1355    Subjective good and bad days. MD okayed me to go in pool,went in this morning about 30 min   Currently in Pain? Yes   Pain Score 4    Pain Location Knee   Pain Orientation Right            [facility] PT Assessment - 08/02/15 0001    AROM   AROM Assessment Site Knee   Right/Left Knee Right   Right Knee Flexion 107                     [facility] Adult PT Treatment/Exercise - 08/02/15 0001    Knee/Hip Exercises: Aerobic   Stationary Bike 58min full rev  seat #10   Elliptical 2 fwd/2 back R4 I 10   Nustep level 5 x 6 minutes   Knee/Hip Exercises: Machines for Strengthening   Cybex Knee Extension 5# 2 sets 10 RT only   Cybex Knee Flexion 15# RT only 2 set s10   Cybex Leg Press 30# 2 sets 10, T only no weight 10 times   Knee/Hip Exercises: Seated   Sit to Sand 3 sets;5 reps;without UE support   Acupuncturist Location RT knee   Electrical Stimulation Action IFC   Electrical Stimulation Goals Pain;Edema   Vasopneumatic   Number Minutes Vasopneumatic  15  minutes   Vasopnuematic Location  Knee   Vasopneumatic Pressure Medium                  PT Short Term Goals - 07/26/15 1357    PT SHORT TERM GOAL #1   Title understand RICE for home   Status Achieved           PT Long Term Goals - 07/25/15 1657    PT LONG TERM GOAL #1   Title independent with advanced HEP   Time 8   Period Weeks   Status New   PT LONG TERM GOAL #2   Title increase AROM to 5-110 degrees flexion   Time 8   Period Weeks   Status New   PT LONG TERM GOAL #3   Title ambulate all distances without assistive device   Time 8   Period Weeks   Status New   PT LONG TERM GOAL #4   Title ascend and descend stairs step over step   Time 8   Period Weeks   Status New   PT LONG  TERM GOAL #5   Title increase strength to 4+/5   Time 8   Period Weeks   Status New   Additional Long Term Goals   Additional Long Term Goals Yes   PT LONG TERM GOAL #6   Title decrease pain iwth knee flexion 50%   Time 8   Period Weeks   Status New               Plan - 08/02/15 1458    Clinical Impression Statement improved ease of ex adn ROM ,able to make ful rev on bike at seat #10. PTA assist on some ex to increase flexion   PT Next Visit Plan gait to increase flexion in swing phase      Patient will benefit from skilled therapeutic intervention in order to improve the following deficits and impairments:  Abnormal gait, Decreased activity tolerance, Decreased mobility, Decreased range of motion, Difficulty walking, Decreased scar mobility, Decreased strength, Increased edema, Impaired flexibility, Pain  Visit Diagnosis: Pain in right knee  Stiffness of right knee, not elsewhere classified  Difficulty in walking, not elsewhere classified  Localized swelling, mass and lump, right lower limb     Problem List Patient Active Problem List   Diagnosis Date Noted  . ESOPHAGEAL STRICTURE 02/27/2010  . HIATAL HERNIA 02/27/2010  . GYNECOMASTIA, UNILATERAL  12/15/2009  . HYPOTHYROIDISM 12/06/2009  . HYPERLIPIDEMIA 12/06/2009  . GERD 12/06/2009  . ABDOMINAL PAIN, EPIGASTRIC 12/06/2009  . COLONIC POLYPS, HYPERPLASTIC, HX OF 12/06/2009    Vanassa Penniman,[name] PTA 08/02/2015, 3:01 PM  [address] [place], [name], [address] Phone: [phone]   Fax:  [phone]  Name: Mario Stevenson MRN: [id number] Date of Birth: 1945-11-20

## 2015-08-04 ENCOUNTER — Ambulatory Visit: Payer: Medicare Other | Admitting: Physical Therapy

## 2015-08-04 DIAGNOSIS — R262 Difficulty in walking, not elsewhere classified: Secondary | ICD-10-CM

## 2015-08-04 DIAGNOSIS — M25661 Stiffness of right knee, not elsewhere classified: Secondary | ICD-10-CM | POA: Diagnosis not present

## 2015-08-04 DIAGNOSIS — R2241 Localized swelling, mass and lump, right lower limb: Secondary | ICD-10-CM | POA: Diagnosis not present

## 2015-08-04 DIAGNOSIS — M25561 Pain in right knee: Secondary | ICD-10-CM

## 2015-08-04 NOTE — Therapy (Signed)
[facility] [place], [name], [address] Phone: [phone]   Fax:  [phone]  Physical Therapy Treatment  Patient Details  Name: Mario Stevenson MRN: [id number] Date of Birth: 02-Oct-1945 Referring Provider: Michaelle Birks  Encounter Date: 08/04/2015      PT End of Session - 08/04/15 1031    Visit Number 5   Date for PT Re-Evaluation 09/25/15   PT Start Time 0930   PT Stop Time 1040   PT Time Calculation (min) 70 min   Activity Tolerance Patient tolerated treatment well      Past Medical History  Diagnosis Date  . GERD (gastroesophageal reflux disease)   . Esophageal stricture   . Hiatal hernia   . Hyperlipidemia   . Hypothyroidism   . Hyperplastic colon polyp     Past Surgical History  Procedure Laterality Date  . Achilles tendon repair      right    There were no vitals filed for this visit.      Subjective Assessment - 08/04/15 0929    Subjective Pt reports no pain today, feeling stiff this morning.    Limitations Standing;Walking;House hold activities   Patient Stated Goals have no pain, better ROM and no difficulty walking.    Currently in Pain? No/denies                         [facility] Adult PT Treatment/Exercise - 08/04/15 0001    Knee/Hip Exercises: Aerobic   Stationary Bike 75min full rev  seat #10   Elliptical 6 min   Nustep level 5 x 6 minutes   Knee/Hip Exercises: Machines for Strengthening   Cybex Knee Extension 5# 2 sets 10 RT only   Cybex Knee Flexion 15# RT only 2 set s10   Cybex Leg Press 30# 2 sets 10, T only no weight 10 times   Other Machine Pulley step down with 30# 2x10    Knee/Hip Exercises: Seated   Sit to Sand 2 sets;5 reps;with UE support  With yellow weighted ball    Cryotherapy   Number Minutes Cryotherapy 15 Minutes   Cryotherapy Location Knee   Type of Cryotherapy Ice pack   Electrical Stimulation   Electrical Stimulation Location RT  Knee   Electrical Stimulation Action IFC   Electrical Stimulation Goals Pain;Edema                  PT Short Term Goals - 07/26/15 1357    PT SHORT TERM GOAL #1   Title understand RICE for home   Status Achieved           PT Long Term Goals - 07/25/15 1657    PT LONG TERM GOAL #1   Title independent with advanced HEP   Time 8   Period Weeks   Status New   PT LONG TERM GOAL #2   Title increase AROM to 5-110 degrees flexion   Time 8   Period Weeks   Status New   PT LONG TERM GOAL #3   Title ambulate all distances without assistive device   Time 8   Period Weeks   Status New   PT LONG TERM GOAL #4   Title ascend and descend stairs step over step   Time 8   Period Weeks   Status New   PT LONG TERM GOAL #5   Title increase strength to 4+/5   Time 8  Period Weeks   Status New   Additional Long Term Goals   Additional Long Term Goals Yes   PT LONG TERM GOAL #6   Title decrease pain iwth knee flexion 50%   Time 8   Period Weeks   Status New               Plan - 08/04/15 1035    Clinical Impression Statement Pt had some difficulty with full revs when going straight from the eliptical to the bilke. After leg press, bike with full revs was acheived. SPTA assist needed on leg press to increase flexion.    Rehab Potential Good   PT Frequency 3x / week   PT Duration 4 weeks   PT Treatment/Interventions ADLs/Self Care Home Management;Cryotherapy;Electrical Stimulation;Moist Heat;Therapeutic exercise;Therapeutic activities;Gait training;Stair training;Functional mobility training;Manual techniques;Passive range of motion;Patient/family education   PT Next Visit Plan Gait and and stair stepping to foster an increase in flexion.       Patient will benefit from skilled therapeutic intervention in order to improve the following deficits and impairments:  Abnormal gait, Decreased activity tolerance, Decreased mobility, Decreased range of motion, Difficulty  walking, Decreased scar mobility, Decreased strength, Increased edema, Impaired flexibility, Pain  Visit Diagnosis: Pain in right knee  Stiffness of right knee, not elsewhere classified  Difficulty in walking, not elsewhere classified     Problem List Patient Active Problem List   Diagnosis Date Noted  . ESOPHAGEAL STRICTURE 02/27/2010  . HIATAL HERNIA 02/27/2010  . GYNECOMASTIA, UNILATERAL 12/15/2009  . HYPOTHYROIDISM 12/06/2009  . HYPERLIPIDEMIA 12/06/2009  . GERD 12/06/2009  . ABDOMINAL PAIN, EPIGASTRIC 12/06/2009  . COLONIC POLYPS, HYPERPLASTIC, HX OF 12/06/2009    Dallie Piles, SPTA 08/04/2015, 10:54 AM  [address] [place], [name], [address] Phone: [phone]   Fax:  [phone]  Name: Mario Stevenson MRN: [id number] Date of Birth: January 24, 1945

## 2015-08-05 ENCOUNTER — Ambulatory Visit: Payer: Medicare Other | Admitting: Physical Therapy

## 2015-08-05 DIAGNOSIS — R262 Difficulty in walking, not elsewhere classified: Secondary | ICD-10-CM | POA: Diagnosis not present

## 2015-08-05 DIAGNOSIS — R2241 Localized swelling, mass and lump, right lower limb: Secondary | ICD-10-CM | POA: Diagnosis not present

## 2015-08-05 DIAGNOSIS — M25661 Stiffness of right knee, not elsewhere classified: Secondary | ICD-10-CM | POA: Diagnosis not present

## 2015-08-05 DIAGNOSIS — M25561 Pain in right knee: Secondary | ICD-10-CM | POA: Diagnosis not present

## 2015-08-05 NOTE — Therapy (Signed)
[place], [name], [address] Phone: [phone]   Fax:  [phone]  Physical Therapy Treatment  Patient Details  Name: Mario Stevenson MRN: [id number] Date of Birth: [birth year] Referring Provider: Michaelle Birks  Encounter Date: 08/05/2015      PT End of Session - 08/05/15 0946    Visit Number 6   Date for PT Re-Evaluation 09/25/15   PT Start Time 0845   PT Stop Time 0952   PT Time Calculation (min) 67 min   Activity Tolerance Patient tolerated treatment well   Behavior During Therapy [facility] for tasks assessed/performed      Past Medical History  Diagnosis Date  . GERD (gastroesophageal reflux disease)   . Esophageal stricture   . Hiatal hernia   . Hyperlipidemia   . Hypothyroidism   . Hyperplastic colon polyp     Past Surgical History  Procedure Laterality Date  . Achilles tendon repair      right    There were no vitals filed for this visit.      Subjective Assessment - 08/05/15 0850    Subjective Pt reports that he is still pain free, still pretty stiff.    Limitations Standing;Walking;House hold activities   Patient Stated Goals have no pain, better ROM and no difficulty walking.    Currently in Pain? No/denies            [facility] PT Assessment - 08/05/15 0001    AROM   AROM Assessment Site Knee   Right/Left Knee Right   Right Knee Extension 10   Right Knee Flexion 110                     [facility] Adult PT Treatment/Exercise - 08/05/15 0001    Knee/Hip Exercises: Aerobic   Stationary Bike 101min full rev  seat #10   Elliptical 3 min fwd 3 min back   Nustep level 5 x 6 minutes   Knee/Hip Exercises: Machines for Strengthening   Cybex Knee Extension 5# 2 sets 10 RT only   Cybex Knee Flexion 15# RT only 2 set s10   Cybex Leg Press 30# 2 sets 10, T only no weight 10 times   Knee/Hip Exercises: Standing   Other Standing Knee Exercises R foot step to 8in step for  flexion stretch x5    Electrical Stimulation   Electrical Stimulation Location RT Knee   Electrical Stimulation Action IFC   Electrical Stimulation Goals Pain;Edema   Vasopneumatic   Number Minutes Vasopneumatic  15 minutes   Vasopnuematic Location  Knee   Vasopneumatic Pressure Medium   Vasopneumatic Temperature  32                  PT Short Term Goals - 07/26/15 1357    PT SHORT TERM GOAL #1   Title understand RICE for home   Status Achieved           PT Long Term Goals - 07/25/15 1657    PT LONG TERM GOAL #1   Title independent with advanced HEP   Time 8   Period Weeks   Status New   PT LONG TERM GOAL #2   Title increase AROM to 5-110 degrees flexion   Time 8   Period Weeks   Status New   PT LONG TERM GOAL #3   Title ambulate all distances without assistive device   Time 8   Period Weeks  Status New   PT LONG TERM GOAL #4   Title ascend and descend stairs step over step   Time 8   Period Weeks   Status New   PT LONG TERM GOAL #5   Title increase strength to 4+/5   Time 8   Period Weeks   Status New   Additional Long Term Goals   Additional Long Term Goals Yes   PT LONG TERM GOAL #6   Title decrease pain iwth knee flexion 50%   Time 8   Period Weeks   Status New               Plan - 08/05/15 [id number]    Clinical Impression Statement Pt still has decreased flexion during ambulation. Pt needed SPTA assist at end range flexion stretch on 8in step. Pt required verbal cueing to be slow and controlled in ther ex for knee ext/flexion strengthening.    Rehab Potential Good   PT Frequency 3x / week   PT Duration 4 weeks   PT Treatment/Interventions ADLs/Self Care Home Management;Cryotherapy;Electrical Stimulation;Moist Heat;Therapeutic exercise;Therapeutic activities;Gait training;Stair training;Functional mobility training;Manual techniques;Passive range of motion;Patient/family education   PT Next Visit Plan Warmup on Nustep, move to  strengthening exercises and ROM exercises. Try for treadmill walking with emphasis on increasing knee flexion.       Patient will benefit from skilled therapeutic intervention in order to improve the following deficits and impairments:  Abnormal gait, Decreased activity tolerance, Decreased mobility, Decreased range of motion, Difficulty walking, Decreased scar mobility, Decreased strength, Increased edema, Impaired flexibility, Pain  Visit Diagnosis: Pain in right knee  Stiffness of right knee, not elsewhere classified  Difficulty in walking, not elsewhere classified     Problem List Patient Active Problem List   Diagnosis Date Noted  . ESOPHAGEAL STRICTURE 02/27/2010  . HIATAL HERNIA 02/27/2010  . GYNECOMASTIA, UNILATERAL 12/15/2009  . HYPOTHYROIDISM 12/06/2009  . HYPERLIPIDEMIA 12/06/2009  . GERD 12/06/2009  . ABDOMINAL PAIN, EPIGASTRIC 12/06/2009  . COLONIC POLYPS, HYPERPLASTIC, HX OF 12/06/2009    Dallie Piles SPTA 08/05/2015, 10:00 AM  [address] [place], [name], [address] Phone: [phone]   Fax:  [phone]  Name: Mario Stevenson MRN: [id number] Date of Birth: February 27, 1945

## 2015-08-09 ENCOUNTER — Ambulatory Visit: Payer: Medicare Other | Admitting: Physical Therapy

## 2015-08-09 ENCOUNTER — Encounter: Payer: Self-pay | Admitting: Physical Therapy

## 2015-08-09 DIAGNOSIS — R2241 Localized swelling, mass and lump, right lower limb: Secondary | ICD-10-CM

## 2015-08-09 DIAGNOSIS — R262 Difficulty in walking, not elsewhere classified: Secondary | ICD-10-CM | POA: Diagnosis not present

## 2015-08-09 DIAGNOSIS — M25661 Stiffness of right knee, not elsewhere classified: Secondary | ICD-10-CM

## 2015-08-09 DIAGNOSIS — M25561 Pain in right knee: Secondary | ICD-10-CM

## 2015-08-09 NOTE — Therapy (Signed)
[place], [name], [address] Phone: [phone]   Fax:  [phone]  Physical Therapy Treatment  Patient Details  Name: Mario Stevenson MRN: [id number] Date of Birth: 04/05/1945 Referring Provider: Michaelle Birks  Encounter Date: 08/09/2015      PT End of Session - 08/09/15 1642    Visit Number 7   Date for PT Re-Evaluation 09/25/15   PT Start Time 1443   PT Stop Time 1545   PT Time Calculation (min) 62 min   Activity Tolerance Patient tolerated treatment well   Behavior During Therapy [facility] for tasks assessed/performed      Past Medical History:  Diagnosis Date  . Esophageal stricture   . GERD (gastroesophageal reflux disease)   . Hiatal hernia   . Hyperlipidemia   . Hyperplastic colon polyp   . Hypothyroidism     Past Surgical History:  Procedure Laterality Date  . ACHILLES TENDON REPAIR     right    There were no vitals filed for this visit.      Subjective Assessment - 08/09/15 1443    Subjective Patient's biggest c/o is stiffness in the right knee   Currently in Pain? Yes   Pain Score 3    Pain Location Knee   Pain Orientation Right   Pain Descriptors / Indicators Tightness            [facility] PT Assessment - 08/09/15 0001      AROM   Right Knee Extension 10   Right Knee Flexion 90     PROM   Right Knee Extension 5   Right Knee Flexion 97                     [facility] Adult PT Treatment/Exercise - 08/09/15 0001      Ambulation/Gait   Gait Comments stairs working on reciprocal pattern needing rail and HHA wiht a lot of cues     High Level Balance   High Level Balance Comments resisted gait all directions     Knee/Hip Exercises: Stretches   Other Knee/Hip Stretches single leg w/c pulls for knee flexion     Knee/Hip Exercises: Aerobic   Elliptical 3 min fwd 3 min back   Nustep level 5 x 6 minutes     Knee/Hip Exercises: Machines for Strengthening    Cybex Leg Press 30# 2x10, then no weight working on more flexion, then no weight with right only     Manual Therapy   Manual therapy comments PROM of the right knee focus on flexion, scar tissue and patellar mobilization                  PT Short Term Goals - 07/26/15 1357      PT SHORT TERM GOAL #1   Title understand RICE for home   Status Achieved           PT Long Term Goals - 08/09/15 1644      PT LONG TERM GOAL #2   Title increase AROM to 5-110 degrees flexion   Status On-going     PT LONG TERM GOAL #3   Title ambulate all distances without assistive device   Status Achieved               Plan - 08/09/15 1642    Clinical Impression Statement Over all the ROM is less, from the last measurement, he is gaurded and very stiff  once you get to 90 degrees flexion.  I was only able to push him to 97 degrees flexion as he would really hike the hip up.   PT Next Visit Plan Focus on getting increase flexion   Consulted and Agree with Plan of Care Patient      Patient will benefit from skilled therapeutic intervention in order to improve the following deficits and impairments:  Abnormal gait, Decreased activity tolerance, Decreased mobility, Decreased range of motion, Difficulty walking, Decreased scar mobility, Decreased strength, Increased edema, Impaired flexibility, Pain  Visit Diagnosis: Pain in right knee  Stiffness of right knee, not elsewhere classified  Difficulty in walking, not elsewhere classified  Localized swelling, mass and lump, right lower limb     Problem List Patient Active Problem List   Diagnosis Date Noted  . ESOPHAGEAL STRICTURE 02/27/2010  . HIATAL HERNIA 02/27/2010  . GYNECOMASTIA, UNILATERAL 12/15/2009  . HYPOTHYROIDISM 12/06/2009  . HYPERLIPIDEMIA 12/06/2009  . GERD 12/06/2009  . ABDOMINAL PAIN, EPIGASTRIC 12/06/2009  . COLONIC POLYPS, HYPERPLASTIC, HX OF 12/06/2009    Sumner Boast., PT 08/09/2015, 4:45  PM  [address] [place], [name], [address] Phone: [phone]   Fax:  [phone]  Name: Mario Stevenson MRN: [id number] Date of Birth: 11-17-1945

## 2015-08-11 ENCOUNTER — Encounter: Payer: Self-pay | Admitting: Physical Therapy

## 2015-08-11 ENCOUNTER — Ambulatory Visit: Payer: Medicare Other | Admitting: Physical Therapy

## 2015-08-11 DIAGNOSIS — R262 Difficulty in walking, not elsewhere classified: Secondary | ICD-10-CM

## 2015-08-11 DIAGNOSIS — M25661 Stiffness of right knee, not elsewhere classified: Secondary | ICD-10-CM | POA: Diagnosis not present

## 2015-08-11 DIAGNOSIS — M25561 Pain in right knee: Secondary | ICD-10-CM | POA: Diagnosis not present

## 2015-08-11 DIAGNOSIS — R2241 Localized swelling, mass and lump, right lower limb: Secondary | ICD-10-CM | POA: Diagnosis not present

## 2015-08-11 NOTE — Therapy (Signed)
[place], [name], [address] Phone: [phone]   Fax:  [phone]  Physical Therapy Treatment  Patient Details  Name: Mario Stevenson MRN: [id number] Date of Birth: 03-23-1945 Referring Provider: Michaelle Birks  Encounter Date: 08/11/2015      PT End of Session - 08/11/15 1528    Visit Number 8   Date for PT Re-Evaluation 09/25/15   PT Start Time 1448   PT Stop Time 1531   PT Time Calculation (min) 43 min   Activity Tolerance Patient tolerated treatment well   Behavior During Therapy [facility] for tasks assessed/performed      Past Medical History:  Diagnosis Date  . Esophageal stricture   . GERD (gastroesophageal reflux disease)   . Hiatal hernia   . Hyperlipidemia   . Hyperplastic colon polyp   . Hypothyroidism     Past Surgical History:  Procedure Laterality Date  . ACHILLES TENDON REPAIR     right    There were no vitals filed for this visit.      Subjective Assessment - 08/11/15 1449    Subjective I have been pretty active today.  Still just stiff   Currently in Pain? Yes   Pain Score 3    Pain Location Knee   Pain Orientation Right   Pain Descriptors / Indicators Tightness   Aggravating Factors  being up a lot   Pain Relieving Factors rest and ice                         [facility] Adult PT Treatment/Exercise - 08/11/15 0001      Ambulation/Gait   Gait Comments stairs working on reciprocal pattern needing rail and HHA wiht a lot of cues     High Level Balance   High Level Balance Activities Tandem walking   High Level Balance Comments resisted gait all directions     Knee/Hip Exercises: Aerobic   Elliptical 3 min fwd 3 min back   Nustep level 5 x 6 minutes     Knee/Hip Exercises: Machines for Strengthening   Cybex Knee Extension 10# 3x10   Cybex Knee Flexion 35# 3x10   Cybex Leg Press 30# 3x10, then no weight working on deeper flexion     Knee/Hip  Exercises: Standing   Forward Step Up Step Height: 4";Step Height: 6";20 reps     Manual Therapy   Manual therapy comments PROM of the right knee focus on flexion, scar tissue and patellar mobilization   Passive ROM some contract relax                  PT Short Term Goals - 07/26/15 1357      PT SHORT TERM GOAL #1   Title understand RICE for home   Status Achieved           PT Long Term Goals - 08/09/15 1644      PT LONG TERM GOAL #2   Title increase AROM to 5-110 degrees flexion   Status On-going     PT LONG TERM GOAL #3   Title ambulate all distances without assistive device   Status Achieved               Plan - 08/11/15 1532    Clinical Impression Statement Patient right knee very tight across the front, patient a lot of fear with the first couple of steps on the stairs and with  PROM.   PT Next Visit Plan Focus on getting increase flexion   Consulted and Agree with Plan of Care Patient      Patient will benefit from skilled therapeutic intervention in order to improve the following deficits and impairments:  Abnormal gait, Decreased activity tolerance, Decreased mobility, Decreased range of motion, Difficulty walking, Decreased scar mobility, Decreased strength, Increased edema, Impaired flexibility, Pain  Visit Diagnosis: Pain in right knee  Stiffness of right knee, not elsewhere classified  Difficulty in walking, not elsewhere classified     Problem List Patient Active Problem List   Diagnosis Date Noted  . ESOPHAGEAL STRICTURE 02/27/2010  . HIATAL HERNIA 02/27/2010  . GYNECOMASTIA, UNILATERAL 12/15/2009  . HYPOTHYROIDISM 12/06/2009  . HYPERLIPIDEMIA 12/06/2009  . GERD 12/06/2009  . ABDOMINAL PAIN, EPIGASTRIC 12/06/2009  . COLONIC POLYPS, HYPERPLASTIC, HX OF 12/06/2009    Sumner Boast., PT 08/11/2015, 3:39 PM  [place] [address] [place], [name],  [address] Phone: [phone]   Fax:  [phone]  Name: Mario Stevenson MRN: [id number] Date of Birth: [birth year]

## 2015-08-16 ENCOUNTER — Encounter: Payer: Self-pay | Admitting: Physical Therapy

## 2015-08-16 ENCOUNTER — Ambulatory Visit: Payer: Medicare Other | Attending: Orthopaedic Surgery | Admitting: Physical Therapy

## 2015-08-16 DIAGNOSIS — M25661 Stiffness of right knee, not elsewhere classified: Secondary | ICD-10-CM | POA: Insufficient documentation

## 2015-08-16 DIAGNOSIS — R2241 Localized swelling, mass and lump, right lower limb: Secondary | ICD-10-CM | POA: Insufficient documentation

## 2015-08-16 DIAGNOSIS — M25561 Pain in right knee: Secondary | ICD-10-CM | POA: Diagnosis not present

## 2015-08-16 DIAGNOSIS — N401 Enlarged prostate with lower urinary tract symptoms: Secondary | ICD-10-CM | POA: Diagnosis not present

## 2015-08-16 DIAGNOSIS — R262 Difficulty in walking, not elsewhere classified: Secondary | ICD-10-CM | POA: Insufficient documentation

## 2015-08-16 DIAGNOSIS — R338 Other retention of urine: Secondary | ICD-10-CM | POA: Diagnosis not present

## 2015-08-16 DIAGNOSIS — Z6835 Body mass index (BMI) 35.0-35.9, adult: Secondary | ICD-10-CM | POA: Diagnosis not present

## 2015-08-16 DIAGNOSIS — R339 Retention of urine, unspecified: Secondary | ICD-10-CM | POA: Diagnosis not present

## 2015-08-16 NOTE — Therapy (Signed)
[place], [name], [address] Phone: [phone]   Fax:  [phone]  Physical Therapy Treatment  Patient Details  Name: Mario Stevenson MRN: [id number] Date of Birth: 12-19-1945 Referring Provider: Michaelle Birks  Encounter Date: 08/16/2015      PT End of Session - 08/16/15 1053    Visit Number 9   Date for PT Re-Evaluation 09/25/15   PT Start Time [id number]   PT Stop Time 1111   PT Time Calculation (min) 56 min      Past Medical History:  Diagnosis Date  . Esophageal stricture   . GERD (gastroesophageal reflux disease)   . Hiatal hernia   . Hyperlipidemia   . Hyperplastic colon polyp   . Hypothyroidism     Past Surgical History:  Procedure Laterality Date  . ACHILLES TENDON REPAIR     right    There were no vitals filed for this visit.      Subjective Assessment - 08/16/15 1021    Subjective stiff but good, MD very pleased   Currently in Pain? Yes   Pain Score 3    Pain Location Knee   Pain Orientation Right            [facility] PT Assessment - 08/16/15 0001      AROM   Right Knee Extension 4   Right Knee Flexion 105                     [facility] Adult PT Treatment/Exercise - 08/16/15 0001      Knee/Hip Exercises: Aerobic   Elliptical 3 min fwd/3 back   Nustep level 5 x 6 minutes     Knee/Hip Exercises: Machines for Strengthening   Cybex Knee Extension 10# 2 sets 10  Rt only   Cybex Knee Flexion 20# 2 sets 10  Rt only   Cybex Leg Press 40# 2 sets 10, 80% with RT only     Knee/Hip Exercises: Standing   Forward Step Up Right;2 sets;10 reps;Hand Hold: 2;Step Height: 6"  up/over/back     Knee/Hip Exercises: Seated   Hamstring Curl Strengthening;Right;2 sets;10 reps  green tband   Sit to Sand 1 set;15 reps;without UE support  stool scoots 1 lap     Vasopneumatic   Number Minutes Vasopneumatic  15 minutes   Vasopnuematic Location  Knee   Vasopneumatic  Pressure Medium                  PT Short Term Goals - 07/26/15 1357      PT SHORT TERM GOAL #1   Title understand RICE for home   Status Achieved           PT Long Term Goals - 08/16/15 1052      PT LONG TERM GOAL #2   Title increase AROM to 5-110 degrees flexion   Baseline 4-105   Status On-going     PT LONG TERM GOAL #3   Title ambulate all distances without assistive device   Status Achieved     PT LONG TERM GOAL #4   Title ascend and descend stairs step over step   Status Achieved     PT LONG TERM GOAL #5   Title increase strength to 4+/5   Status On-going               Plan - 08/16/15 1053    Clinical Impression Statement pt tolerated therapy very  well, able to do steps step over step adn amb without AD with goo dknee flexion in swing phase.   PT Next Visit Plan func ROM and endurance, D/C next week      Patient will benefit from skilled therapeutic intervention in order to improve the following deficits and impairments:  Abnormal gait, Decreased activity tolerance, Decreased mobility, Decreased range of motion, Difficulty walking, Decreased scar mobility, Decreased strength, Increased edema, Impaired flexibility, Pain  Visit Diagnosis: Stiffness of right knee, not elsewhere classified  Localized swelling, mass and lump, right lower limb     Problem List Patient Active Problem List   Diagnosis Date Noted  . ESOPHAGEAL STRICTURE 02/27/2010  . HIATAL HERNIA 02/27/2010  . GYNECOMASTIA, UNILATERAL 12/15/2009  . HYPOTHYROIDISM 12/06/2009  . HYPERLIPIDEMIA 12/06/2009  . GERD 12/06/2009  . ABDOMINAL PAIN, EPIGASTRIC 12/06/2009  . COLONIC POLYPS, HYPERPLASTIC, HX OF 12/06/2009    PAYSEUR,[name] PTA 08/16/2015, 10:54 AM  [address] [place], [name], [address] Phone: [phone]   Fax:  [phone]  Name: Mario Stevenson MRN: [id number] Date of Birth:  Dec 24, 1945

## 2015-08-17 DIAGNOSIS — R3914 Feeling of incomplete bladder emptying: Secondary | ICD-10-CM | POA: Diagnosis not present

## 2015-08-17 DIAGNOSIS — N401 Enlarged prostate with lower urinary tract symptoms: Secondary | ICD-10-CM | POA: Diagnosis not present

## 2015-08-17 DIAGNOSIS — M109 Gout, unspecified: Secondary | ICD-10-CM | POA: Diagnosis not present

## 2015-08-17 DIAGNOSIS — E039 Hypothyroidism, unspecified: Secondary | ICD-10-CM | POA: Diagnosis not present

## 2015-08-17 DIAGNOSIS — N183 Chronic kidney disease, stage 3 (moderate): Secondary | ICD-10-CM | POA: Diagnosis not present

## 2015-08-17 DIAGNOSIS — K219 Gastro-esophageal reflux disease without esophagitis: Secondary | ICD-10-CM | POA: Diagnosis not present

## 2015-08-18 ENCOUNTER — Ambulatory Visit: Payer: Medicare Other | Admitting: Physical Therapy

## 2015-08-18 DIAGNOSIS — R2241 Localized swelling, mass and lump, right lower limb: Secondary | ICD-10-CM | POA: Diagnosis not present

## 2015-08-18 DIAGNOSIS — M25561 Pain in right knee: Secondary | ICD-10-CM | POA: Diagnosis not present

## 2015-08-18 DIAGNOSIS — M25661 Stiffness of right knee, not elsewhere classified: Secondary | ICD-10-CM | POA: Diagnosis not present

## 2015-08-18 DIAGNOSIS — R262 Difficulty in walking, not elsewhere classified: Secondary | ICD-10-CM

## 2015-08-18 NOTE — Therapy (Signed)
[place], [name], [address] Phone: [phone]   Fax:  [phone]  Physical Therapy Treatment  Patient Details  Name: EDGARD BROOMFIELD MRN: [id number] Date of Birth: Jul 30, 1945 Referring Provider: Michaelle Birks  Encounter Date: 08/18/2015      PT End of Session - 08/18/15 1442    Visit Number 10   Date for PT Re-Evaluation 09/25/15   PT Start Time 1400   PT Stop Time 1454   PT Time Calculation (min) 54 min   Activity Tolerance Patient tolerated treatment well   Behavior During Therapy [facility] for tasks assessed/performed      Past Medical History:  Diagnosis Date  . Esophageal stricture   . GERD (gastroesophageal reflux disease)   . Hiatal hernia   . Hyperlipidemia   . Hyperplastic colon polyp   . Hypothyroidism     Past Surgical History:  Procedure Laterality Date  . ACHILLES TENDON REPAIR     right    There were no vitals filed for this visit.      Subjective Assessment - 08/18/15 1359    Subjective Feeling good today not really any pain.   Limitations Standing;Walking;House hold activities   Patient Stated Goals have no pain, better ROM and no difficulty walking.    Currently in Pain? Yes   Pain Score 1    Pain Location Knee   Pain Orientation Right                         [facility] Adult PT Treatment/Exercise - 08/18/15 0001      Knee/Hip Exercises: Stretches   Other Knee/Hip Stretches Knee flexion stretch on fitter 10 sec hold x5     Knee/Hip Exercises: Aerobic   Elliptical 3 min fwd/3 back   Nustep level 5 x 6 minutes     Knee/Hip Exercises: Standing   Lateral Step Up Right;2 sets;10 reps;Step Height: 6"   Forward Step Up Right;2 sets;10 reps;Step Height: 6"  Up/over/back   Wall Squat 2 sets;10 reps   Other Standing Knee Exercises Squats at sink 2x10   Other Standing Knee Exercises Lateral steps with green tband 3 trips around gym, control;led descent  from 6in step 2x10     Knee/Hip Exercises: Seated   Hamstring Curl Strengthening;Right;2 sets;10 reps;Limitations  Green Tband   Sit to [employer] 1 set;10 reps  With weighted yellow ball     Vasopneumatic   Number Minutes Vasopneumatic  15 minutes   Vasopnuematic Location  Knee   Vasopneumatic Pressure Medium   Vasopneumatic Temperature  32                  PT Short Term Goals - 07/26/15 1357      PT SHORT TERM GOAL #1   Title understand RICE for home   Status Achieved           PT Long Term Goals - 08/16/15 1052      PT LONG TERM GOAL #2   Title increase AROM to 5-110 degrees flexion   Baseline 4-105   Status On-going     PT LONG TERM GOAL #3   Title ambulate all distances without assistive device   Status Achieved     PT LONG TERM GOAL #4   Title ascend and descend stairs step over step   Status Achieved     PT LONG TERM GOAL #5   Title increase strength to  4+/5   Status On-going               Plan - 09/16/2015 1442    Clinical Impression Statement Pt tolerated added exercises and tbanmd side stepping exercise well. Pt did require 2 rest breaks on eliptical lasting approx. 1 min ea.    Rehab Potential Good   PT Frequency 3x / week   PT Duration 4 weeks   PT Treatment/Interventions ADLs/Self Care Home Management;Cryotherapy;Electrical Stimulation;Moist Heat;Therapeutic exercise;Therapeutic activities;Gait training;Stair training;Functional mobility training;Manual techniques;Passive range of motion;Patient/family education   PT Next Visit Plan Work on increasing R knee flexion and endurance.       Patient will benefit from skilled therapeutic intervention in order to improve the following deficits and impairments:  Abnormal gait, Decreased activity tolerance, Decreased mobility, Decreased range of motion, Difficulty walking, Decreased scar mobility, Decreased strength, Increased edema, Impaired flexibility, Pain  Visit Diagnosis: Stiffness of  right knee, not elsewhere classified  Localized swelling, mass and lump, right lower limb  Pain in right knee  Difficulty in walking, not elsewhere classified       G-Codes - 2015-09-16 1442    Functional Assessment Tool Used foto 48% limitation   Functional Limitation Mobility: Walking and moving around   Mobility: Walking and Moving Around Current Status [phone]) At least 40 percent but less than 60 percent impaired, limited or restricted   Mobility: Walking and Moving Around Goal Status [phone]) At least 40 percent but less than 60 percent impaired, limited or restricted      Problem List Patient Active Problem List   Diagnosis Date Noted  . ESOPHAGEAL STRICTURE 02/27/2010  . HIATAL HERNIA 02/27/2010  . GYNECOMASTIA, UNILATERAL 12/15/2009  . HYPOTHYROIDISM 12/06/2009  . HYPERLIPIDEMIA 12/06/2009  . GERD 12/06/2009  . ABDOMINAL PAIN, EPIGASTRIC 12/06/2009  . COLONIC POLYPS, HYPERPLASTIC, HX OF 12/06/2009    Dallie Piles, SPTA Sep 16, 2015, 2:50 PM  [place] [address] [place], [name], [address] Phone: [phone]   Fax:  [phone]  Name: MENZO BRAAM MRN: [id number] Date of Birth: 1945-05-04

## 2015-08-25 ENCOUNTER — Ambulatory Visit: Payer: Medicare Other | Admitting: Physical Therapy

## 2015-08-25 DIAGNOSIS — R262 Difficulty in walking, not elsewhere classified: Secondary | ICD-10-CM | POA: Diagnosis not present

## 2015-08-25 DIAGNOSIS — R2241 Localized swelling, mass and lump, right lower limb: Secondary | ICD-10-CM | POA: Diagnosis not present

## 2015-08-25 DIAGNOSIS — M25561 Pain in right knee: Secondary | ICD-10-CM | POA: Diagnosis not present

## 2015-08-25 DIAGNOSIS — M25661 Stiffness of right knee, not elsewhere classified: Secondary | ICD-10-CM | POA: Diagnosis not present

## 2015-08-25 NOTE — Therapy (Signed)
[place], [name], [address] Phone: [phone]   Fax:  [phone]  Physical Therapy Treatment  Patient Details  Name: Mario Stevenson MRN: [id number] Date of Birth: 1945-09-09 Referring Provider: Michaelle Birks  Encounter Date: 08/25/2015      PT End of Session - 08/25/15 1612    Visit Number 11   Date for PT Re-Evaluation 09/25/15   PT Start Time 1520   PT Stop Time 1623   PT Time Calculation (min) 63 min   Activity Tolerance Patient tolerated treatment well   Behavior During Therapy [facility] for tasks assessed/performed      Past Medical History:  Diagnosis Date  . Esophageal stricture   . GERD (gastroesophageal reflux disease)   . Hiatal hernia   . Hyperlipidemia   . Hyperplastic colon polyp   . Hypothyroidism     Past Surgical History:  Procedure Laterality Date  . ACHILLES TENDON REPAIR     right    There were no vitals filed for this visit.      Subjective Assessment - 08/25/15 1531    Subjective Tightness is still there but not any pain today.    Limitations Standing;Walking;House hold activities   Patient Stated Goals have no pain, better ROM and no difficulty walking.    Currently in Pain? No/denies            [facility] PT Assessment - 08/25/15 0001      AROM   Right Knee Extension 4   Right Knee Flexion 106     Strength   Overall Strength Comments 4+/5 flexion and extension                     [facility] Adult PT Treatment/Exercise - 08/25/15 0001      Knee/Hip Exercises: Aerobic   Elliptical 3 min fwd/3 back   Nustep level 5 x 6 minutes     Knee/Hip Exercises: Machines for Strengthening   Cybex Leg Press 40# 2x10, RLE only x10, knee flexion stretch x3     Knee/Hip Exercises: Standing   Lateral Step Up Right;2 sets;10 reps;Step Height: 6"   Forward Step Up Right;2 sets;10 reps;Step Height: 8"   Wall Squat 2 sets;10 reps   Wall Squat Limitations On  green pball   Other Standing Knee Exercises Squats at sink 2x10     Knee/Hip Exercises: Seated   Stool Scoot - Round Trips 3   Sit to Sand 1 set;10 reps;without UE support  With weighted yellow ball     Vasopneumatic   Number Minutes Vasopneumatic  15 minutes   Vasopnuematic Location  Knee   Vasopneumatic Pressure Medium   Vasopneumatic Temperature  32     Manual Therapy   Manual Therapy --                  PT Short Term Goals - 07/26/15 1357      PT SHORT TERM GOAL #1   Title understand RICE for home   Status Achieved           PT Long Term Goals - 08/25/15 1615      PT LONG TERM GOAL #2   Title increase AROM to 5-110 degrees flexion   Baseline 4-106   Time 8   Period Weeks   Status Not Met     PT LONG TERM GOAL #5   Title increase strength to 4+/5   Time 8  Period Weeks   Status Achieved               Plan - 08/25/15 1614    Clinical Impression Statement Pt had improved knee flexion during step ups to 8 in step. Pt only required 1 break on eliptical today. Pt did not meet AROM goal but provided subjective report that he is happy with current ROM. Pt understands to continue with stretches and exercise to improve at home.    Rehab Potential Good   PT Frequency 3x / week   PT Duration 4 weeks   PT Treatment/Interventions ADLs/Self Care Home Management;Cryotherapy;Electrical Stimulation;Moist Heat;Therapeutic exercise;Therapeutic activities;Gait training;Stair training;Functional mobility training;Manual techniques;Passive range of motion;Patient/family education   PT Next Visit Plan Discharge today       Patient will benefit from skilled therapeutic intervention in order to improve the following deficits and impairments:  Abnormal gait, Decreased activity tolerance, Decreased mobility, Decreased range of motion, Difficulty walking, Decreased scar mobility, Decreased strength, Increased edema, Impaired flexibility, Pain  Visit  Diagnosis: Stiffness of right knee, not elsewhere classified  Localized swelling, mass and lump, right lower limb  Pain in right knee  Difficulty in walking, not elsewhere classified     Problem List Patient Active Problem List   Diagnosis Date Noted  . ESOPHAGEAL STRICTURE 02/27/2010  . HIATAL HERNIA 02/27/2010  . GYNECOMASTIA, UNILATERAL 12/15/2009  . HYPOTHYROIDISM 12/06/2009  . HYPERLIPIDEMIA 12/06/2009  . GERD 12/06/2009  . ABDOMINAL PAIN, EPIGASTRIC 12/06/2009  . COLONIC POLYPS, HYPERPLASTIC, HX OF 12/06/2009    Dallie Piles, SPTA 08/25/2015, 4:21 PM  [place] [address] [place], [name], [address] Phone: [phone]   Fax:  [phone]  Name: REILEY KEISLER MRN: [id number] Date of Birth: 20-Apr-1945

## 2015-08-26 ENCOUNTER — Ambulatory Visit: Payer: Medicare Other | Admitting: Physical Therapy

## 2015-12-27 DIAGNOSIS — Z96651 Presence of right artificial knee joint: Secondary | ICD-10-CM | POA: Diagnosis not present

## 2016-03-08 DIAGNOSIS — M109 Gout, unspecified: Secondary | ICD-10-CM | POA: Diagnosis not present

## 2016-03-08 DIAGNOSIS — R600 Localized edema: Secondary | ICD-10-CM | POA: Diagnosis not present

## 2016-03-08 DIAGNOSIS — M791 Myalgia: Secondary | ICD-10-CM | POA: Diagnosis not present

## 2016-03-08 DIAGNOSIS — E039 Hypothyroidism, unspecified: Secondary | ICD-10-CM | POA: Diagnosis not present

## 2016-03-08 DIAGNOSIS — R0602 Shortness of breath: Secondary | ICD-10-CM | POA: Diagnosis not present

## 2016-03-09 ENCOUNTER — Telehealth: Payer: Self-pay | Admitting: *Deleted

## 2016-03-09 NOTE — Telephone Encounter (Signed)
Notes sent scheduling

## 2016-03-11 NOTE — Progress Notes (Signed)
Cardiology Office Note  NEW PATIENT VISIT   Date:  03/12/2016   ID:  Mario Stevenson, DOB 1945/07/26, MRN [id number]  PCP:  Shirline Frees, MD  Cardiologist:  Dr. Stanford Breed Dr. Marlou Porch      Chief Complaint  Patient presents with  . New Patient (Initial Visit)    SOB and fatigue      History of Present Illness: Mario Stevenson is a 71 y.o. male who presents for fatigue and SOB. Referred by Dr. Shirline Frees.     He has a hx of hypothyroidism, GERD and BPH  Also gout that has been stable.  Now with SOB since knee surgery a year ago.  But over last 6 months it has increased.  He has developed lower ext edema, and Dr. Kenton Kingfisher has given pt po lasix PRN.   He and his wife travel freq. Recently to [place] and plan to go to [place] in March 03/26/16 for a mission trip.    He denies chest pain or pressure.  He has SOB on exertion and now just with shower he becomes SOB.  He does have FH of CAD. He had nuc study in 2010 that was normal.  His PCP has ordered a CXR.     Past Medical History:  Diagnosis Date  . Esophageal stricture   . GERD (gastroesophageal reflux disease)   . Hiatal hernia   . Hyperlipidemia   . Hyperplastic colon polyp   . Hypothyroidism     Past Surgical History:  Procedure Laterality Date  . ACHILLES TENDON REPAIR     right     Current Outpatient Prescriptions  Medication Sig Dispense Refill  . Colchicine 0.6 MG CAPS Take by mouth daily.    Marland Kitchen levothyroxine (SYNTHROID, LEVOTHROID) 125 MCG tablet Take 125 mcg by mouth daily.    Marland Kitchen omeprazole (PRILOSEC) 20 MG capsule Take 20 mg by mouth daily.     No current facility-administered medications for this visit.     Allergies:   Penicillins    Social History:  The patient  reports that he has never smoked. He has never used smokeless tobacco. He reports that he does not drink alcohol or use drugs.   Family History:  The patient's family history includes Diabetes in his mother and sister; Heart attack in  his brother and mother; Heart disease in his brother; Heart disease (age of onset: 31) in his mother; Pancreatic cancer in his father.    ROS:  General:no colds or fevers, no weight changes Skin:no rashes or ulcers HEENT:no blurred vision, no congestion CV:see HPI PUL:see HPI GI:no diarrhea constipation or melena, no indigestion GU:no hematuria, no dysuria MS:no joint pain, no claudication Neuro:no syncope, no lightheadedness Endo:no diabetes, + thyroid disease  Wt Readings from Last 3 Encounters:  03/12/16 266 lb (120.7 kg)  06/09/15 259 lb (117.5 kg)  12/07/11 256 lb 2 oz (116.2 kg)     PHYSICAL EXAM: VS:  BP 129/80   Pulse 86   Ht 5\' 11"  (1.803 m)   Wt 266 lb (120.7 kg)   BMI 37.10 kg/m  , BMI Body mass index is 37.1 kg/m. General:Pleasant affect, NAD Skin:Warm and dry, brisk capillary refill HEENT:normocephalic, sclera clear, mucus membranes moist Neck:supple, no JVD, no bruits  Heart:S1S2 RRR with soft 1/6 systolic murmur, gallup, rub or click Lungs:clear without rales, rhonchi, or wheezes [id number], non tender, + BS, do not palpate liver spleen or masses Ext:no lower ext edema, 2+ pedal pulses, 2+ radial pulses  Neuro:alert and oriented X 3, MAE, follows commands, + facial symmetry    EKG:  EKG is NOT ordered today. The ekg from 03/08/16 with SR normal EKG.    Recent Labs: 06/09/2015: ALT 31 06/27/2015: BUN 24; Creatinine, Ser 1.52; Potassium 4.8; Sodium 144    Lipid Panel No results found for: CHOL, TRIG, HDL, CHOLHDL, VLDL, LDLCALC, LDLDIRECT     Other studies Reviewed: Additional studies/ records that were reviewed today include: OV note from PCP.   ASSESSMENT AND PLAN:  1.  DOE and edema.  With freq travel to other countries will check VQ to rule out chronic PE.  I discussed with Dr. Stanford Breed as well.  We will have pt do exercise myoview and echo.  Will do our best to have done before trip but if abnormal they may need to cancel trip for further eval.   We briefly discussed cath if nuc is +  They will follow up with Dr. Marlou Porch for further plan or if abnormal test will have come back earlier.    2.  Hypothyroidism followed by PCP    Current medicines are reviewed with the patient today.  The patient Has no concerns regarding medicines.  The following changes have been made:  See above Labs/ tests ordered today include:see above  Disposition:   FU:  see above  Signed, Cecilie Kicks, NP  03/12/2016 1:40 PM    [facility] [place], [place] [place], [name] Phone: [phone]; Fax: [phone]

## 2016-03-12 ENCOUNTER — Encounter: Payer: Self-pay | Admitting: Cardiology

## 2016-03-12 ENCOUNTER — Ambulatory Visit (INDEPENDENT_AMBULATORY_CARE_PROVIDER_SITE_OTHER): Payer: Medicare Other | Admitting: Cardiology

## 2016-03-12 VITALS — BP 129/80 | HR 86 | Ht 71.0 in | Wt 266.0 lb

## 2016-03-12 DIAGNOSIS — R0602 Shortness of breath: Secondary | ICD-10-CM | POA: Diagnosis not present

## 2016-03-12 DIAGNOSIS — R609 Edema, unspecified: Secondary | ICD-10-CM

## 2016-03-12 NOTE — Patient Instructions (Addendum)
Medication Instructions:  Your physician recommends that you continue on your current medications as directed. Please refer to the Current Medication list given to you today.   Labwork: NONE  Testing/Procedures: 1. YOU WILL NEED TO BE SCHEDULED FOR A VQ SCAN TO BE DONE AT [place]; DX SOB, NEED TO R/O PE; PER LAURA INGOLD, PAC TO DO TEST ING ASAP DUE TO PT WILL BE OUT OF THE COUNTRY 03/26/16 ; THIS WILL NEED TO BE DONE 2 DAYS BEFORE PT CAN HAVE MYOVIEW PER LAURA INGOLD, PAC    2. Your physician has requested that you have en exercise stress myoview. For further information please visit [email or web address]. Please follow instruction sheet, as given.  TO DO TEST ING ASAP DUE TO PT WILL BE OUT OF THE COUNTRY 03/26/16  3. Your physician has requested that you have an echocardiogram. Echocardiography is a painless test that uses sound waves to create images of your heart. It provides your doctor with information about the size and shape of your heart and how well your heart's chambers and valves are working. This procedure takes approximately one hour. There are no restrictions for this procedure.  TO DO TEST ING ASAP DUE TO PT WILL BE OUT OF THE COUNTRY 03/26/16   Follow-Up: AS NEEDED AT THIS TIME; FOLLOW UP WILL DEPEND ON TEST RESULTS PER LAURA INGOLD, PAC   Any Other Special Instructions Will Be Listed Below (If Applicable).     If you need a refill on your cardiac medications before your next appointment, please call your pharmacy.

## 2016-03-15 ENCOUNTER — Telehealth (HOSPITAL_COMMUNITY): Payer: Self-pay

## 2016-03-15 NOTE — Telephone Encounter (Signed)
Encounter complete. 

## 2016-03-16 ENCOUNTER — Ambulatory Visit (HOSPITAL_COMMUNITY)
Admission: RE | Admit: 2016-03-16 | Discharge: 2016-03-16 | Disposition: A | Discharge status: Home / Self Care | Payer: Medicare Other | Source: Ambulatory Visit | Attending: Cardiology | Admitting: Cardiology

## 2016-03-16 DIAGNOSIS — R0602 Shortness of breath: Secondary | ICD-10-CM

## 2016-03-16 MED ORDER — TECHNETIUM TO 99M ALBUMIN AGGREGATED
4.3400 | Freq: Once | INTRAVENOUS | Status: AC | PRN
  Administered 2016-03-16: 4.34 via INTRAVENOUS

## 2016-03-16 MED ORDER — TECHNETIUM TC 99M DIETHYLENETRIAME-PENTAACETIC ACID
29.7000 | Freq: Once | INTRAVENOUS | Status: AC | PRN
  Administered 2016-03-16: 29.7 via RESPIRATORY_TRACT

## 2016-03-19 ENCOUNTER — Ambulatory Visit (HOSPITAL_COMMUNITY)
Admission: RE | Admit: 2016-03-19 | Discharge: 2016-03-19 | Disposition: A | Discharge status: Home / Self Care | Payer: Medicare Other | Source: Ambulatory Visit | Attending: Cardiology | Admitting: Cardiology

## 2016-03-19 DIAGNOSIS — Z96659 Presence of unspecified artificial knee joint: Secondary | ICD-10-CM | POA: Diagnosis not present

## 2016-03-19 DIAGNOSIS — I071 Rheumatic tricuspid insufficiency: Secondary | ICD-10-CM | POA: Diagnosis not present

## 2016-03-19 DIAGNOSIS — R0602 Shortness of breath: Secondary | ICD-10-CM | POA: Diagnosis not present

## 2016-03-19 DIAGNOSIS — R609 Edema, unspecified: Secondary | ICD-10-CM | POA: Diagnosis not present

## 2016-03-19 LAB — ECHOCARDIOGRAM COMPLETE
AVLVOTPG: 2 mmHg
CHL CUP DOP CALC LVOT VTI: 17.3 cm
CHL CUP MV DEC (S): 194
CHL CUP TV REG PEAK VELOCITY: 224 cm/s
E/e' ratio: 17.38
EWDT: 194 ms
FS: 35 % (ref 28–44)
IVS/LV PW RATIO, ED: 1.76
LA ID, A-P, ES: 50 mm
LA vol index: 23.3 mL/m2
LA vol: 55.5 mL
LADIAMINDEX: 2.1 cm/m2
LAVOLA4C: 63.6 mL
LEFT ATRIUM END SYS DIAM: 50 mm
LV E/e' medial: 17.38
LV E/e'average: 17.38
LV TDI E'LATERAL: 4.13
LV TDI E'MEDIAL: 5.66
LVELAT: 4.13 cm/s
LVOT SV: 54 mL
LVOT area: 3.14 cm2
LVOT peak vel: 73.2 cm/s
LVOTD: 20 mm
MV Peak grad: 2 mmHg
MV pk A vel: 60.1 m/s
MVPKEVEL: 71.8 m/s
PW: 11.9 mm — AB (ref 0.6–1.1)
RV LATERAL S' VELOCITY: 11.1 cm/s
RV TAPSE: 20.9 mm
RV sys press: 23 mmHg
TRMAXVEL: 224 cm/s

## 2016-03-19 NOTE — Progress Notes (Signed)
  Echocardiogram 2D Echocardiogram has been performed.  Darlina Sicilian M 03/19/2016, 2:37 PM

## 2016-03-20 ENCOUNTER — Ambulatory Visit (HOSPITAL_COMMUNITY)
Admission: RE | Admit: 2016-03-20 | Discharge: 2016-03-20 | Disposition: A | Discharge status: Home / Self Care | Payer: Medicare Other | Source: Ambulatory Visit | Attending: Cardiovascular Disease | Admitting: Cardiovascular Disease

## 2016-03-20 DIAGNOSIS — R0602 Shortness of breath: Secondary | ICD-10-CM | POA: Diagnosis not present

## 2016-03-20 LAB — MYOCARDIAL PERFUSION IMAGING
CHL CUP MPHR: 151 {beats}/min
CHL RATE OF PERCEIVED EXERTION: 17
CSEPED: 8 min
Estimated workload: 8.4 METS
Exercise duration (sec): 1 s
LV sys vol: 33 mL
LVDIAVOL: 98 mL (ref 62–150)
Peak HR: 148 {beats}/min
Percent HR: 98 %
Rest HR: 65 {beats}/min
SDS: 1
SRS: 1
SSS: 2
TID: 0.81

## 2016-03-20 MED ORDER — TECHNETIUM TC 99M TETROFOSMIN IV KIT
31.3000 | PACK | Freq: Once | INTRAVENOUS | Status: DC | PRN
  Filled 2016-03-20: qty 32

## 2016-03-20 MED ORDER — TECHNETIUM TC 99M TETROFOSMIN IV KIT
10.1000 | PACK | Freq: Once | INTRAVENOUS | Status: DC | PRN
  Filled 2016-03-20: qty 11

## 2016-03-21 ENCOUNTER — Telehealth: Payer: Self-pay | Admitting: Cardiology

## 2016-03-21 NOTE — Telephone Encounter (Signed)
Notes Recorded by Jeanann Lewandowsky, RMA on 03/21/2016 at 9:21 AM EST Pt has been made aware of his test results and has been scheduled for f/u with Dr. Marlou Porch 04/27/16 (when pt will be back from traveling) ------

## 2016-03-21 NOTE — Telephone Encounter (Signed)
Pt aware of results of echo and nuc study.  Dr Jacalyn Lefevre note mentions possible use of Lasix as treatment for SOB.  Pt reports having SOB and edema and would like to know if he should be treated on it .  advised I will forward to Dr Marlou Porch and Mickel Baas for review and he will be receiving a call back with instructions.

## 2016-03-21 NOTE — Telephone Encounter (Signed)
New message    Pt calling back to speak with Pam to go over his test results , he is home now

## 2016-03-22 MED ORDER — FUROSEMIDE 20 MG PO TABS: 20.0000 mg | ORAL_TABLET | Freq: Every day | ORAL | 6 refills | Status: DC

## 2016-03-22 NOTE — Telephone Encounter (Signed)
I think it is reasonable to start Lasix 20 mg once a day. Please have him follow-up with me or Mickel Baas in 2 weeks. Check basic metabolic profile at this time. Candee Furbish, MD

## 2016-03-22 NOTE — Telephone Encounter (Signed)
Pt aware of recommendations and orders.  RX sent into [facility] as requested.  Pt is coming to be out of the country until 04/23/16 and has an appt 4/13 for f/u.

## 2016-04-12 ENCOUNTER — Encounter: Payer: Self-pay | Admitting: *Deleted

## 2016-04-12 ENCOUNTER — Encounter: Payer: Self-pay | Admitting: Cardiology

## 2016-04-13 ENCOUNTER — Telehealth: Payer: Self-pay | Admitting: Cardiology

## 2016-04-13 NOTE — Telephone Encounter (Signed)
New message  Pt wife is requesting call from nurse about appt.

## 2016-04-13 NOTE — Telephone Encounter (Signed)
I spoke to patient and got verbal permission to speak with wife, Hassan Rowan.  She states our office called and tried to change pt's appt and they are going out of the country 04/17-27/18.  She states Dr. Kingsley Plan wanted to see patient in 4 weeks. I advised the appointment will not be in exactly 4 weeks d/t their travel.  I sch appt for 05/14/16 @ 0840 w/ Dr. Marlou Porch.  Pt's wife also asked that I review the recent study results with her. I did so. She voiced understanding and agreed with plan.

## 2016-04-27 ENCOUNTER — Ambulatory Visit: Payer: Medicare Other | Admitting: Cardiology

## 2016-05-11 DIAGNOSIS — E039 Hypothyroidism, unspecified: Secondary | ICD-10-CM | POA: Diagnosis not present

## 2016-05-14 ENCOUNTER — Encounter: Payer: Self-pay | Admitting: Cardiology

## 2016-05-14 ENCOUNTER — Ambulatory Visit (INDEPENDENT_AMBULATORY_CARE_PROVIDER_SITE_OTHER): Payer: Medicare Other | Admitting: Cardiology

## 2016-05-14 ENCOUNTER — Encounter (INDEPENDENT_AMBULATORY_CARE_PROVIDER_SITE_OTHER): Payer: Self-pay

## 2016-05-14 VITALS — BP 128/80 | HR 74 | Ht 71.0 in | Wt 264.4 lb

## 2016-05-14 DIAGNOSIS — R609 Edema, unspecified: Secondary | ICD-10-CM

## 2016-05-14 DIAGNOSIS — R0602 Shortness of breath: Secondary | ICD-10-CM

## 2016-05-14 NOTE — Patient Instructions (Addendum)
Medication Instructions: - Your physician recommends that you continue on your current medications as directed. Please refer to the Current Medication list given to you today.  Labwork: - none ordered  Procedures/Testing: - none ordered  Follow-Up: - Dr. Marlou Porch will see you back on an as needed basis.   Any Additional Special Instructions Will Be Listed Below (If Applicable).     If you need a refill on your cardiac medications before your next appointment, please call your pharmacy.

## 2016-05-14 NOTE — Progress Notes (Signed)
Cardiology Office Note:    Date:  05/14/2016   ID:  STAR CHEESE, DOB 12-03-1945, MRN [id number]  PCP:  Shirline Frees, MD  Cardiologist:  Candee Furbish, MD  Electrophysiologist: none  Referring MD: Shirline Frees, MD     History of Present Illness:    Mario Stevenson is a 71 y.o. male with a hx of fatigue and dyspnea since knee surgery about a year ago here for follow up.   SOB was present with minimal activity such as shower.   ECHO on 03/19/16 showed normal EF with posterior wall thickness, normal PA pressures.  VQ - normal NUC stress 03/20/16:  - Normal perfusion. LVEF 66% with normal wall motion. This is a low risk study. CXR - normal  Dr. Stanford Breed (who was DOD when Cecilie Kicks saw him, cleared him for travel). [place] and [place].   He is feeling much better since his original evaluation. Crit work is reassuring as above. They did ask about notated grade 2 diastolic dysfunction. Stiffness could be playing a role. Lasix seems to have helped as well. There are also asking about sensation of warmth below the knees bilaterally. Sometimes this is seen when edema is present and stretching the receptors in the lower extremity. Overall I'm pleased that he is feeling better. No chest pain, no syncopal, no bleeding.  Has FHX of CAD.   Past Medical History:  Diagnosis Date  . Esophageal stricture   . GERD (gastroesophageal reflux disease)   . Hiatal hernia   . Hyperlipidemia   . Hyperplastic colon polyp   . Hypothyroidism     Past Surgical History:  Procedure Laterality Date  . ACHILLES TENDON REPAIR     right    Current Medications: Current Meds  Medication Sig  . allopurinol (ZYLOPRIM) 100 MG tablet Take 100 mg by mouth daily.  . Colchicine 0.6 MG CAPS Take by mouth daily.  Marland Kitchen doxazosin (CARDURA) 4 MG tablet Take 4 mg by mouth daily.   . furosemide (LASIX) 20 MG tablet Take 1 tablet (20 mg total) by mouth daily.  Marland Kitchen levothyroxine (SYNTHROID, LEVOTHROID) 125 MCG  tablet Take 125 mcg by mouth daily.  Marland Kitchen omeprazole (PRILOSEC) 20 MG capsule Take 20 mg by mouth daily.     Allergies:   Penicillins   Social History   Social History  . Marital status: Married    Spouse name: N/A  . Number of children: 3  . Years of education: N/A   Occupational History  . self employed [employer] History Main Topics  . Smoking status: Never Smoker  . Smokeless tobacco: Never Used  . Alcohol use No  . Drug use: No  . Sexual activity: Not Asked   Other Topics Concern  . None   Social History Narrative  . None     family history includes Diabetes in his mother and sister; Heart attack in his brother and mother; Heart disease in his brother; Heart disease (age of onset: 51) in his mother; Pancreatic cancer in his father. There is no history of Colon cancer. ROS:   Please see the history of present illness.   No orthopnea, no PND, breathing is better  All other systems reviewed and are negative.   EKGs/Labs/Other Studies Reviewed:    EKG:  None today  Recent Labs: 06/09/2015: ALT 31 06/27/2015: BUN 24; Creatinine, Ser 1.52; Potassium 4.8; Sodium 144   Recent Lipid Panel No results found for: CHOL, TRIG, HDL, CHOLHDL, VLDL, LDLCALC,  LDLDIRECT  Physical Exam:    VS:  BP 128/80   Pulse 74   Ht 5\' 11"  (1.803 m)   Wt 264 lb 6.4 oz (119.9 kg)   SpO2 98%   BMI 36.88 kg/m     Wt Readings from Last 3 Encounters:  05/14/16 264 lb 6.4 oz (119.9 kg)  03/20/16 266 lb (120.7 kg)  03/12/16 266 lb (120.7 kg)     GEN:  Well nourished, well developed in no acute distress HEENT: Normal NECK: No JVD; No carotid bruits LYMPHATICS: No lymphadenopathy CARDIAC: RRR, no murmurs, rubs, gallops RESPIRATORY:  Clear to auscultation without rales, wheezing or rhonchi  ABDOMEN: Soft, non-tender, non-distended MUSCULOSKELETAL:  Trace to 1+ BLE edema; No deformity  - normal pulses SKIN: Warm and dry NEUROLOGIC:  Alert and oriented x 3 PSYCHIATRIC:   Normal affect   ASSESSMENT:    1. Shortness of breath   2. Edema, unspecified type   3. Morbid obesity ([place])    PLAN:    In order of problems listed above:  Dyspnea  - Overall doing much better.  - Cardiac workup was reassuring.  - Perhaps mild diastolic dysfunction was playing a role as well. We discussed at length weight loss, 60 pounds, continue activity.  - I'm fine with him continuing his Lasix.  Morbid obesity  - BMI greater than 35 with comorbidities  - Weight loss advocated.  Diastolic dysfunction  - This can be a normal part of aging. Heart does lose some elasticity.  - Overall ejection fraction normal. Reassurance.  Lower extremity edema  - Sometimes this can be associated with paresthesias as he is describing, warmth, neuropathic changes. His distal pulses, dorsalis pedis and posterior tibial are reassuringly normal.  We will see him back as needed. I am seeing his wife soon.   Medication Adjustments/Labs and Tests Ordered: Current medicines are reviewed at length with the patient today.  Concerns regarding medicines are outlined above. Labs and tests ordered and medication changes are outlined in the patient instructions below:  Patient Instructions  Medication Instructions: - Your physician recommends that you continue on your current medications as directed. Please refer to the Current Medication list given to you today.  Labwork: - none ordered  Procedures/Testing: - none ordered  Follow-Up: - Dr. Marlou Porch will see you back on an as needed basis.   Any Additional Special Instructions Will Be Listed Below (If Applicable).     If you need a refill on your cardiac medications before your next appointment, please call your pharmacy.      Signed, Candee Furbish, MD  05/14/2016 9:10 AM    Mario Stevenson

## 2016-06-14 ENCOUNTER — Emergency Department (HOSPITAL_BASED_OUTPATIENT_CLINIC_OR_DEPARTMENT_OTHER)
Admission: EM | Admit: 2016-06-14 | Discharge: 2016-06-14 | Disposition: A | Discharge status: Home / Self Care | Payer: Medicare Other | Attending: Emergency Medicine | Admitting: Emergency Medicine

## 2016-06-14 ENCOUNTER — Emergency Department (HOSPITAL_BASED_OUTPATIENT_CLINIC_OR_DEPARTMENT_OTHER): Payer: Medicare Other

## 2016-06-14 ENCOUNTER — Encounter (HOSPITAL_BASED_OUTPATIENT_CLINIC_OR_DEPARTMENT_OTHER): Payer: Self-pay | Admitting: *Deleted

## 2016-06-14 DIAGNOSIS — T1490XS Injury, unspecified, sequela: Secondary | ICD-10-CM | POA: Diagnosis not present

## 2016-06-14 DIAGNOSIS — X509XXA Other and unspecified overexertion or strenuous movements or postures, initial encounter: Secondary | ICD-10-CM | POA: Insufficient documentation

## 2016-06-14 DIAGNOSIS — M25562 Pain in left knee: Secondary | ICD-10-CM | POA: Diagnosis not present

## 2016-06-14 DIAGNOSIS — S8992XA Unspecified injury of left lower leg, initial encounter: Secondary | ICD-10-CM | POA: Diagnosis not present

## 2016-06-14 DIAGNOSIS — Z79899 Other long term (current) drug therapy: Secondary | ICD-10-CM | POA: Diagnosis not present

## 2016-06-14 DIAGNOSIS — T1490XA Injury, unspecified, initial encounter: Secondary | ICD-10-CM

## 2016-06-14 DIAGNOSIS — E039 Hypothyroidism, unspecified: Secondary | ICD-10-CM | POA: Diagnosis not present

## 2016-06-14 DIAGNOSIS — M25462 Effusion, left knee: Secondary | ICD-10-CM | POA: Diagnosis not present

## 2016-06-14 MED ORDER — HYDROCODONE-ACETAMINOPHEN 5-325 MG PO TABS: 1.0000 | ORAL_TABLET | ORAL | 0 refills | Status: DC | PRN

## 2016-06-14 MED FILL — HYDROCODON-APAP 5-325: 5-325 | 2 days supply | Qty: 6 | Fill #0

## 2016-06-14 NOTE — ED Provider Notes (Signed)
[facility] DEPT [facility] Provider Note   CSN: [id number] Arrival date & time: 06/14/16  1417     History   Chief Complaint Chief Complaint  Patient presents with  . Knee Injury    HPI Mario Stevenson is a 71 y.o. male.  The history is provided by the patient and medical records.  Knee Pain   This is a new problem. The current episode started 1 to 2 hours ago. The problem occurs constantly. The problem has not changed since onset.The pain is present in the left knee. The quality of the pain is described as aching. The pain is at a severity of 5/10. The pain is moderate. Pertinent negatives include no numbness, full range of motion and no stiffness. He has tried nothing for the symptoms. The treatment provided no relief. There has been no history of extremity trauma (buckled while carrying something).    Past Medical History:  Diagnosis Date  . Esophageal stricture   . GERD (gastroesophageal reflux disease)   . Hiatal hernia   . Hyperlipidemia   . Hyperplastic colon polyp   . Hypothyroidism     Patient Active Problem List   Diagnosis Date Noted  . Status post right knee replacement 06/30/2015  . Osteoarthritis of right knee 06/29/2015  . ESOPHAGEAL STRICTURE 02/27/2010  . HIATAL HERNIA 02/27/2010  . GYNECOMASTIA, UNILATERAL 12/15/2009  . HYPOTHYROIDISM 12/06/2009  . HYPERLIPIDEMIA 12/06/2009  . GERD 12/06/2009  . ABDOMINAL PAIN, EPIGASTRIC 12/06/2009  . COLONIC POLYPS, HYPERPLASTIC, HX OF 12/06/2009    Past Surgical History:  Procedure Laterality Date  . ACHILLES TENDON REPAIR     right       Home Medications    Prior to Admission medications   Medication Sig Start Date End Date Taking? Authorizing Provider  allopurinol (ZYLOPRIM) 100 MG tablet Take 100 mg by mouth daily.    [provider]  Colchicine 0.6 MG CAPS Take by mouth daily.    [provider]  doxazosin (CARDURA) 4 MG tablet Take 4 mg by mouth daily.  02/08/16   [provider]  furosemide (LASIX) 20 MG tablet Take 1 tablet (20 mg total) by mouth daily. 03/22/16 06/20/16  Jerline Pain, MD  levothyroxine (SYNTHROID, LEVOTHROID) 125 MCG tablet Take 125 mcg by mouth daily.    [provider]  omeprazole (PRILOSEC) 20 MG capsule Take 20 mg by mouth daily.    [provider]    Family History Family History  Problem Relation Age of Onset  . Pancreatic cancer Father   . Diabetes Mother   . Heart disease Mother 70       CABG  . Heart attack Mother   . Diabetes Sister   . Heart attack Brother   . Heart disease Brother   . Colon cancer Neg Hx     Social History Social History  Substance Use Topics  . Smoking status: Never Smoker  . Smokeless tobacco: Never Used  . Alcohol use No     Allergies   Penicillins   Review of Systems Review of Systems  Constitutional: Negative for activity change, chills, diaphoresis, fatigue and fever.  HENT: Negative for congestion and rhinorrhea.   Eyes: Negative for visual disturbance.  Respiratory: Negative for cough, chest tightness, shortness of breath, wheezing and stridor.   Cardiovascular: Negative for chest pain, palpitations and leg swelling.  Gastrointestinal: Negative for abdominal distention, abdominal pain, blood in stool, constipation, diarrhea, nausea and vomiting.  Genitourinary: Negative for difficulty urinating,  dysuria and flank pain.  Musculoskeletal: Positive for joint swelling. Negative for back pain, gait problem, neck pain, neck stiffness and stiffness.  Skin: Negative for rash and wound.  Neurological: Negative for dizziness, weakness, light-headedness, numbness and headaches.  Psychiatric/Behavioral: Negative for agitation.  All other systems reviewed and are negative.    Physical Exam Updated Vital Signs BP (!) 138/96 (BP Location: Left Arm)   Pulse 86   Temp 98 F (36.7 C) (Oral)   Resp 20   SpO2 98%   Physical Exam  Constitutional: He appears  well-developed and well-nourished.  HENT:  Head: Normocephalic and atraumatic.  Mouth/Throat: Oropharynx is clear and moist. No oropharyngeal exudate.  Eyes: Conjunctivae are normal.  Neck: Neck supple.  Cardiovascular: Normal rate and regular rhythm.   No murmur heard. Pulmonary/Chest: Effort normal and breath sounds normal. No respiratory distress. He has no wheezes. He exhibits no tenderness.  Abdominal: Soft. There is no tenderness.  Genitourinary: No penile tenderness.  Musculoskeletal: He exhibits edema and tenderness. He exhibits no deformity.       Left knee: He exhibits swelling. He exhibits normal range of motion, no deformity, no laceration, no erythema and normal patellar mobility. Tenderness found. Lateral joint line tenderness noted.       Legs: Neurological: He is alert. No sensory deficit. He exhibits normal muscle tone.  Skin: Skin is warm and dry. Capillary refill takes less than 2 seconds. No erythema. No pallor.  Psychiatric: He has a normal mood and affect.  Nursing note and vitals reviewed.    ED Treatments / Results  Labs (all labs ordered are listed, but only abnormal results are displayed) Labs Reviewed - No data to display  EKG  EKG Interpretation None       Radiology Dg Knee Complete 4 Views Left  Result Date: 06/14/2016 CLINICAL DATA:  71 year old male status post fall while walking today. Severe lateral knee pain and swelling. EXAM: LEFT KNEE - COMPLETE 4+ VIEW COMPARISON:  None. FINDINGS: Moderate to large suprapatellar joint effusion. Tricompartmental degenerative spurring. Patella appears intact. Underlying bone mineralization is within normal limits. Moderate medial compartment joint space loss. No acute fracture identified. IMPRESSION: Moderate to large joint effusion with underlying tricompartmental degenerative changes. No acute fracture or dislocation. Electronically Signed   By: Genevie Ann M.D.   On: 06/14/2016 14:47    Procedures Procedures  (including critical care time)  Medications Ordered in ED Medications - No data to display   Initial Impression / Assessment and Plan / ED Course  I have reviewed the triage vital signs and the nursing notes.  Pertinent labs & imaging results that were available during my care of the patient were reviewed by me and considered in my medical decision making (see chart for details).     TAHJAY BINION is a 71 y.o. male with past medical history significant for GERD, hyperlipidemia, hypothyroidism, and previous right knee replacement who presents with left knee pain. Patient says that he was carrying a cooler critical determinate when he felt his left knee buckled and dropped a cooler. He denies any trauma to the knee. He says that he had mild pain at onset but then had gradually worsening pain. He says that he is having difficulty bearing weight due to the discomfort. He denies any bleeding or skin injury. He describes his pain as moderate to severe. He describes it as a 5 out of 10 in severity and nonradiating. He denies a numbness, tingly, or weakness of the  leg. He denies any other injuries and denies other complaint.  History and exam are seen above. On exam, patient has tenderness in the lateral compartment of the left knee. Patient has pain with knee manipulation. No significant instability that present on exam. Normal pulses and normal sensation of the lower extremity. No hip tenderness. Lungs clear and abdomen otherwise nontender. No other abnormal physical findings on exam.  Patient had x-rays showing moderate to large joint effusion with degenerative changes. No evidence of acute fracture or dislocation.  Given the lack of bony injury, healed patient is safe for discharge. Patient has recalled his orthopedist who did his other knee for evaluation tomorrow. Patient informed that he may have a ligament or meniscal injury that may need further workup and evaluation. Patient understood.  Patient given a strong knee brace and crutches. Patient given perception for several doses of pain medication. Patient understood return precautions for any new or worsened symptoms and understood follow-up instructions. Patient had no other questions or concerns and patient was discharged in good condition.    Final Clinical Impressions(s) / ED Diagnoses   Final diagnoses:  Blunt trauma  Acute pain of left knee    New Prescriptions Discharge Medication List as of 06/14/2016  3:56 PM    START taking these medications   Details  HYDROcodone-acetaminophen (NORCO/VICODIN) 5-325 MG tablet Take 1 tablet by mouth every 4 (four) hours as needed., Starting Thu 06/14/2016, Print       Clinical Impression: 1. Acute pain of left knee   2. Blunt trauma     Disposition: Discharge  Condition: Good  I have discussed the results, Dx and Tx plan with the pt(& family if present). He/she/they expressed understanding and agree(s) with the plan. Discharge instructions discussed at great length. Strict return precautions discussed and pt &/or family have verbalized understanding of the instructions. No further questions at time of discharge.    Discharge Medication List as of 06/14/2016  3:56 PM    START taking these medications   Details  HYDROcodone-acetaminophen (NORCO/VICODIN) 5-325 MG tablet Take 1 tablet by mouth every 4 (four) hours as needed., Starting Thu 06/14/2016, Print        Follow Up: [place] [address] [id number] [place] [id number] [phone]  If symptoms worsen  Cassell Clement., MD [place] [id number] [phone]  Go in 1 day Go to your appointment tomorrow.     Coreon Simkins, Gwenyth Allegra, MD 06/15/16 1349

## 2016-06-14 NOTE — ED Notes (Signed)
ED Provider at bedside. 

## 2016-06-14 NOTE — ED Notes (Signed)
Pt verbalized understanding of discharge instructions and denies any further questions at this time.    EMT fitting pt with crutches and knee immobilizer.

## 2016-06-14 NOTE — Discharge Instructions (Signed)
Please go to your appointment with your orthopedist tomorrow for further evaluation of your knee injury. We did not find evidence of fracture or dislocation on our imaging. Please use the knee brace and crutches to minimize any movement. Please take the pain medicine for breakthrough pain. If any symptoms change or worsen, please return to the nearest emergency department.

## 2016-06-14 NOTE — ED Triage Notes (Signed)
He was walking this am and his left knee gave out when he was sitting an object down.  Pain with ambulation.

## 2016-06-15 DIAGNOSIS — M1712 Unilateral primary osteoarthritis, left knee: Secondary | ICD-10-CM | POA: Diagnosis not present

## 2016-06-29 DIAGNOSIS — M1712 Unilateral primary osteoarthritis, left knee: Secondary | ICD-10-CM | POA: Diagnosis not present

## 2016-07-10 ENCOUNTER — Telehealth: Payer: Self-pay | Admitting: Gastroenterology

## 2016-07-11 NOTE — Telephone Encounter (Signed)
Left message for patient to call back, right now I do not have anything sooner if he would like to see Dr. Havery Moros. I can get him in sooner with APP. Asked that he call back so I can get a little bit more information to give to Dr. Havery Moros.

## 2016-07-13 ENCOUNTER — Other Ambulatory Visit: Payer: Self-pay

## 2016-07-13 DIAGNOSIS — R1010 Upper abdominal pain, unspecified: Secondary | ICD-10-CM

## 2016-07-13 NOTE — Progress Notes (Signed)
Done

## 2016-07-13 NOTE — Telephone Encounter (Signed)
Okay thanks for the update. In the interim we can obtain CBC, CMET, lipase, and he can try increasing prilosec to twice daily to see if that helps. Thanks

## 2016-07-13 NOTE — Telephone Encounter (Signed)
Patient has been having upper abdominal pain that radiates to back for a couple of weeks. I had call lvm for patient to call back 2 days ago, tried again. Spoke to wife, let her know that our APP appointments are available, they are able to order all testing, labs that may be necessary and always reviewed by the physician. She did accept a sooner appointment for 7/5 with Nevin Bloodgood.

## 2016-07-13 NOTE — Telephone Encounter (Signed)
Unable to reach patient's wife on her cell phone, left vm on other phone with directions to get labs done prior to ov on 7/5 and to try increasing Prilosec to BID. Call if they have questions. Labs in [place].

## 2016-07-16 ENCOUNTER — Other Ambulatory Visit (INDEPENDENT_AMBULATORY_CARE_PROVIDER_SITE_OTHER): Payer: Medicare Other

## 2016-07-16 DIAGNOSIS — R7989 Other specified abnormal findings of blood chemistry: Secondary | ICD-10-CM

## 2016-07-16 DIAGNOSIS — R1010 Upper abdominal pain, unspecified: Secondary | ICD-10-CM | POA: Diagnosis not present

## 2016-07-16 DIAGNOSIS — R945 Abnormal results of liver function studies: Secondary | ICD-10-CM

## 2016-07-16 LAB — CBC WITH DIFFERENTIAL/PLATELET
BASOS PCT: 0.8 % (ref 0.0–3.0)
Basophils Absolute: 0.1 10*3/uL (ref 0.0–0.1)
EOS ABS: 0.4 10*3/uL (ref 0.0–0.7)
EOS PCT: 6.3 % — AB (ref 0.0–5.0)
HEMATOCRIT: 45.6 % (ref 39.0–52.0)
Hemoglobin: 15.3 g/dL (ref 13.0–17.0)
LYMPHS PCT: 39.5 % (ref 12.0–46.0)
Lymphs Abs: 2.6 10*3/uL (ref 0.7–4.0)
MCHC: 33.6 g/dL (ref 30.0–36.0)
MCV: 92 fl (ref 78.0–100.0)
MONOS PCT: 6.8 % (ref 3.0–12.0)
Monocytes Absolute: 0.4 10*3/uL (ref 0.1–1.0)
NEUTROS ABS: 3 10*3/uL (ref 1.4–7.7)
Neutrophils Relative %: 46.6 % (ref 43.0–77.0)
PLATELETS: 216 10*3/uL (ref 150.0–400.0)
RBC: 4.96 Mil/uL (ref 4.22–5.81)
RDW: 14 % (ref 11.5–15.5)
WBC: 6.5 10*3/uL (ref 4.0–10.5)

## 2016-07-16 LAB — COMPREHENSIVE METABOLIC PANEL
ALBUMIN: 4.3 g/dL (ref 3.5–5.2)
ALT: 41 U/L (ref 0–53)
AST: 31 U/L (ref 0–37)
Alkaline Phosphatase: 165 U/L — ABNORMAL HIGH (ref 39–117)
BILIRUBIN TOTAL: 0.7 mg/dL (ref 0.2–1.2)
BUN: 19 mg/dL (ref 6–23)
CALCIUM: 9.5 mg/dL (ref 8.4–10.5)
CHLORIDE: 107 meq/L (ref 96–112)
CO2: 26 mEq/L (ref 19–32)
CREATININE: 1.22 mg/dL (ref 0.40–1.50)
GFR: 62.44 mL/min (ref >60.00)
Glucose, Bld: 90 mg/dL (ref 70–99)
Potassium: 4.6 mEq/L (ref 3.5–5.1)
Sodium: 141 mEq/L (ref 135–145)
Total Protein: 7.1 g/dL (ref 6.0–8.3)

## 2016-07-16 LAB — HEPATIC FUNCTION PANEL
ALT: 41 U/L (ref 0–53)
AST: 31 U/L (ref 0–37)
Albumin: 4.3 g/dL (ref 3.5–5.2)
Alkaline Phosphatase: 165 U/L — ABNORMAL HIGH (ref 39–117)
BILIRUBIN DIRECT: 0.2 mg/dL (ref 0.0–0.3)
BILIRUBIN TOTAL: 0.7 mg/dL (ref 0.2–1.2)
Total Protein: 7.1 g/dL (ref 6.0–8.3)

## 2016-07-16 LAB — LIPASE: LIPASE: 19 U/L (ref 11.0–59.0)

## 2016-07-19 ENCOUNTER — Ambulatory Visit (INDEPENDENT_AMBULATORY_CARE_PROVIDER_SITE_OTHER): Payer: Medicare Other | Admitting: Nurse Practitioner

## 2016-07-19 ENCOUNTER — Encounter: Payer: Self-pay | Admitting: Nurse Practitioner

## 2016-07-19 ENCOUNTER — Ambulatory Visit: Payer: Medicare Other | Admitting: Nurse Practitioner

## 2016-07-19 VITALS — BP 120/80 | HR 68 | Ht 70.0 in | Wt 261.2 lb

## 2016-07-19 DIAGNOSIS — R1010 Upper abdominal pain, unspecified: Secondary | ICD-10-CM

## 2016-07-19 DIAGNOSIS — R1310 Dysphagia, unspecified: Secondary | ICD-10-CM | POA: Diagnosis not present

## 2016-07-19 NOTE — Progress Notes (Signed)
     HPI: Patient is a 71 year old male known to Dr. Havery Moros. He was last seen in May for evaluation of mid abdominal pain and elevated alk phos. CT scan abd /pelvis was unrevealing except for lesion in right liver lobe which turned out to be a hemangioma on a follow up MRI. Patient has continued to have the intermittent mid upper abdominal pain. He called the office late June. We requested labs, advised him to increase Prilosec to twice daily and gave him an appointment for today.  Alkaline phosphatase is still elevated. It is 165, up from 142 last year . Lipase is normal. CBC normal . The pain is a dull ache, does not usually radiate through to the back except for one recent episode when it radiated around both sides into his back. The pain is often fain , intensifying at certain times for unclear reasons. Episodes can last hours.  He cannot relate the episodes to eating, or bowel movements. No associated nausea. Weight is stable.  In addition to above, patient complains of recurrent solid food dysphagia 1-2 times a week. He hears and feels upper abdomen gurgling when swallowing water. He had an EGD with savary dilation of an esophageal stricture in 2012. He did well until 3-4 months ago. Takes prilosec everyday.   Past Medical History:  Diagnosis Date  . Esophageal stricture   . GERD (gastroesophageal reflux disease)   . Hiatal hernia   . Hyperlipidemia   . Hyperplastic colon polyp   . Hypothyroidism     Patient's surgical history, family medical history, social history, medications and allergies were all reviewed in Epic    Physical Exam: BP 120/80   Pulse 68   Ht '5\' 10"'$  (1.778 m)   Wt 261 lb 3.2 oz (118.5 kg)   BMI 37.48 kg/m   GENERAL: well developed white male in NAD PSYCH: :Pleasant, cooperative, normal affect EENT:  conjunctiva pink, mucous membranes moist, neck supple without masses CARDIAC:  RRR,  No murmur heard, no peripheral edema PULM: Normal respiratory effort,  lungs CTA bilaterally, no wheezing ABDOMEN:  soft, nontender, nondistended, no obvious masses, no hepatomegaly,  normal bowel sounds SKIN:  turgor, no lesions seen Musculoskeletal:  Normal muscle tone, normal strength NEURO: Alert and oriented x 3, no focal neurologic deficits   ASSESSMENT and PLAN:  1. 71 yo male with 3-4 month history of solid food dysphagia. He has an esophageal stricture in 2012, s/p Savary dilation. No weight loss or other alarm symptoms. Rule out recurrent stricture.  -For further evaluation patient will be scheduled for EGD . The risks and benefits of EGD with possible dilation were discussed and the patient agrees to proceed. -Advised patient to eat small bites, chew well with liquids in between bites to avoid food impaction.   2.  Intermittent mid abdominal pain over last 1.5 years. Pain not related to eating, BMs or physical activity. No acute findings on advanced imaging studies last year,  including MRCP (no pancreatic lesion, no cholelithiasis). No weight loss.  Further evaluation at time of EGD.   3. Isolated alk phos, chronic.  Remaining liver labs unremarkable. Unrevealing MRCP last year.  -bone origin? Consider ordering isoenzymes to determine source  4. Colon cancer screening. Overdue, last colonoscopy was 2008.  -He plans to get this scheduled after workup of #1. No bowel changes, rectal bleeding, anemia or other alarm symptoms.    Tye Savoy , NP 07/19/2016, 2:25 PM

## 2016-07-19 NOTE — Patient Instructions (Signed)
If you are age 70 or older, your body mass index should be between 23-30. Your Body mass index is 37.48 kg/m. If this is out of the aforementioned range listed, please consider follow up with your Primary Care Provider.  If you are age 75 or younger, your body mass index should be between 19-25. Your Body mass index is 37.48 kg/m. If this is out of the aformentioned range listed, please consider follow up with your Primary Care Provider.   You have been scheduled for an endoscopy. Please follow written instructions given to you at your visit today. If you use inhalers (even only as needed), please bring them with you on the day of your procedure. Your physician has requested that you go to www.startemmi.com and enter the access code given to you at your visit today. This web site gives a general overview about your procedure. However, you should still follow specific instructions given to you by our office regarding your preparation for the procedure.  You will need to schedule a screening colonoscopy in the future.  Thank you for choosing me and [facility].   Tye Savoy, NP

## 2016-07-20 ENCOUNTER — Ambulatory Visit (AMBULATORY_SURGERY_CENTER): Payer: Medicare Other | Admitting: Gastroenterology

## 2016-07-20 ENCOUNTER — Encounter: Payer: Self-pay | Admitting: Gastroenterology

## 2016-07-20 VITALS — BP 119/75 | HR 52 | Temp 98.6°F | Resp 17 | Ht 70.0 in | Wt 261.0 lb

## 2016-07-20 DIAGNOSIS — K222 Esophageal obstruction: Secondary | ICD-10-CM

## 2016-07-20 DIAGNOSIS — R1310 Dysphagia, unspecified: Secondary | ICD-10-CM

## 2016-07-20 DIAGNOSIS — K2900 Acute gastritis without bleeding: Secondary | ICD-10-CM | POA: Diagnosis not present

## 2016-07-20 DIAGNOSIS — K2950 Unspecified chronic gastritis without bleeding: Secondary | ICD-10-CM | POA: Diagnosis not present

## 2016-07-20 DIAGNOSIS — K219 Gastro-esophageal reflux disease without esophagitis: Secondary | ICD-10-CM | POA: Diagnosis not present

## 2016-07-20 DIAGNOSIS — K449 Diaphragmatic hernia without obstruction or gangrene: Secondary | ICD-10-CM | POA: Diagnosis not present

## 2016-07-20 DIAGNOSIS — R109 Unspecified abdominal pain: Secondary | ICD-10-CM | POA: Diagnosis not present

## 2016-07-20 MED ORDER — SODIUM CHLORIDE 0.9 % IV SOLN: 500.0000 mL | INTRAVENOUS | Status: DC

## 2016-07-20 NOTE — Op Note (Signed)
[facility] Patient Name: Mario Stevenson Procedure Date: 07/20/2016 10:18 AM MRN: [id number] Endoscopist: Remo Lipps P. Armbruster MD, MD Age: 71 Referring MD:  Date of Birth: 1945/09/28 Gender: Male Account #: [id number] Procedure:                Upper GI endoscopy Indications:              Abdominal pain, Dysphagia Medicines:                Monitored Anesthesia Care Procedure:                Pre-Anesthesia Assessment:                           - Prior to the procedure, a History and Physical                            was performed, and patient medications and                            allergies were reviewed. The patient's tolerance of                            previous anesthesia was also reviewed. The risks                            and benefits of the procedure and the sedation                            options and risks were discussed with the patient.                            All questions were answered, and informed consent                            was obtained. Prior Anticoagulants: The patient has                            taken no previous anticoagulant or antiplatelet                            agents. ASA Grade Assessment: III - A patient with                            severe systemic disease. After reviewing the risks                            and benefits, the patient was deemed in                            satisfactory condition to undergo the procedure.                           After obtaining informed consent, the endoscope was  passed under direct vision. Throughout the                            procedure, the patient's blood pressure, pulse, and                            oxygen saturations were monitored continuously. The                            Endoscope was introduced through the mouth, and                            advanced to the second part of duodenum. The upper                            GI endoscopy was  accomplished without difficulty.                            The patient tolerated the procedure well. Scope In: Scope Out: Findings:                 Esophagogastric landmarks were identified: the                            Z-line was found at 33 cm, the gastroesophageal                            junction was found at 33 cm and the upper extent of                            the gastric folds was found at 40 cm from the                            incisors.                           A large, 7 cm hiatal hernia was present.                           The distal esophagus was tortous with a suggestion                            of a possible mild stenosis at the GEJ. The exam of                            the esophagus was otherwise normal.                           A TTS dilator was passed through the scope.                            Dilation with an 18-19-20 mm balloon dilator was  performed to 20 mm in the lower third of the                            esophagus, no mucosal wrents were noted following                            this intervention.                           Striped mildly erythematous mucosa was found in the                            gastric antrum. Biopsies were taken with a cold                            forceps for Helicobacter pylori testing.                           The exam of the stomach was otherwise normal.                           The duodenal bulb and second portion of the                            duodenum were normal. Complications:            No immediate complications. Estimated blood loss:                            Minimal. Estimated Blood Loss:     Estimated blood loss was minimal. Impression:               - Esophagogastric landmarks identified.                           - 7 cm hiatal hernia.                           - Tortous distal espophagus with possible mild                            stenosis at GEJ - dilated to 68mm  without any                            mucosal wrents noted.                           - Erythematous mucosa in the antrum. Biopsied.                           - Normal duodenal bulb and second portion of the                            duodenum. Recommendation:           - Patient has a contact number available for  emergencies. The signs and symptoms of potential                            delayed complications were discussed with the                            patient. Return to normal activities tomorrow.                            Written discharge instructions were provided to the                            patient.                           - Resume previous diet.                           - Continue present medications.                           - Await pathology results and course following                            dilation. Suspect large hiatal hernia may be cause                            of dysphagia. Remo Lipps P. Armbruster MD, MD 07/20/2016 10:46:28 AM This report has been signed electronically.

## 2016-07-20 NOTE — Progress Notes (Signed)
Called to room to assist during endoscopic procedure.  Patient ID and intended procedure confirmed with present staff. Received instructions for my participation in the procedure from the performing physician.  

## 2016-07-20 NOTE — Progress Notes (Signed)
To PACU, vss patent aw report to rn 

## 2016-07-20 NOTE — Patient Instructions (Signed)
YOU HAD AN ENDOSCOPIC PROCEDURE TODAY AT [facility]:   Refer to the procedure report that was given to you for any specific questions about what was found during the examination.  If the procedure report does not answer your questions, please call your gastroenterologist to clarify.  If you requested that your care partner not be given the details of your procedure findings, then the procedure report has been included in a sealed envelope for you to review at your convenience later.  YOU SHOULD EXPECT: Some feelings of bloating in the abdomen. Passage of more gas than usual.  Walking can help get rid of the air that was put into your GI tract during the procedure and reduce the bloating. If you had a lower endoscopy (such as a colonoscopy or flexible sigmoidoscopy) you may notice spotting of blood in your stool or on the toilet paper. If you underwent a bowel prep for your procedure, you may not have a normal bowel movement for a few days.  Please Note:  You might notice some irritation and congestion in your nose or some drainage.  This is from the oxygen used during your procedure.  There is no need for concern and it should clear up in a day or so.  SYMPTOMS TO REPORT IMMEDIATELY:    Following upper endoscopy (EGD)  Vomiting of blood or coffee ground material  New chest pain or pain under the shoulder blades  Painful or persistently difficult swallowing  New shortness of breath  Fever of 100F or higher  Black, tarry-looking stools  For urgent or emergent issues, a gastroenterologist can be reached at any hour by calling [phone].   DIET:  We do recommend a small meal at first, but then you may proceed to your regular diet.  Drink plenty of fluids but you should avoid alcoholic beverages for 24 hours.  ACTIVITY:  You should plan to take it easy for the rest of today and you should NOT DRIVE or use heavy machinery until tomorrow (because of the sedation medicines used  during the test).    FOLLOW UP: Our staff will call the number listed on your records the next business day following your procedure to check on you and address any questions or concerns that you may have regarding the information given to you following your procedure. If we do not reach you, we will leave a message.  However, if you are feeling well and you are not experiencing any problems, there is no need to return our call.  We will assume that you have returned to your regular daily activities without incident.  If any biopsies were taken you will be contacted by phone or by letter within the next 1-3 weeks.  Please call us at [phone] if you have not heard about the biopsies in 3 weeks.    SIGNATURES/CONFIDENTIALITY: You and/or your care partner have signed paperwork which will be entered into your electronic medical record.  These signatures attest to the fact that that the information above on your After Visit Summary has been reviewed and is understood.  Full responsibility of the confidentiality of this discharge information lies with you and/or your care-partner.  After dilation diet and hiatal hernia information given.

## 2016-07-23 ENCOUNTER — Telehealth: Payer: Self-pay | Admitting: *Deleted

## 2016-07-23 NOTE — Progress Notes (Signed)
Agree with assessment and plan as outlined.  Await course following EGD.  Alk phos elevation is chronic - GGT is elevated, thus very likely hepatic in etiology. He's had a negative serologic workup and MRI of the liver, negative other than 2.8cm hepatic hemangioma and mild steatosis. Unclear etiology specifically for AP. Next step in evaluation if it rises over time would be a liver biopsy. We discussed this at time of EGD - only mild elevation, will monitor for now, repeat LFTs in 6 months. He agreed. Agree he should schedule colonoscopy when he is able to

## 2016-07-23 NOTE — Telephone Encounter (Signed)
  Follow up Call-  Call back number 07/20/2016  Post procedure Call Back phone  # [phone]  Permission to leave phone message Yes  Some recent data might be hidden     Patient questions:  Do you have a fever, pain , or abdominal swelling? No. Pain Score  0 *  Have you tolerated food without any problems? Yes.    Have you been able to return to your normal activities? Yes.    Do you have any questions about your discharge instructions: Diet   No. Medications  No. Follow up visit  No.  Do you have questions or concerns about your Care? No.  Actions: * If pain score is 4 or above: No action needed, pain <4.

## 2016-07-26 DIAGNOSIS — D225 Melanocytic nevi of trunk: Secondary | ICD-10-CM | POA: Diagnosis not present

## 2016-07-26 DIAGNOSIS — L821 Other seborrheic keratosis: Secondary | ICD-10-CM | POA: Diagnosis not present

## 2016-07-26 DIAGNOSIS — L814 Other melanin hyperpigmentation: Secondary | ICD-10-CM | POA: Diagnosis not present

## 2016-07-26 DIAGNOSIS — L570 Actinic keratosis: Secondary | ICD-10-CM | POA: Diagnosis not present

## 2016-07-27 ENCOUNTER — Encounter: Payer: Self-pay | Admitting: Gastroenterology

## 2016-08-17 DIAGNOSIS — N400 Enlarged prostate without lower urinary tract symptoms: Secondary | ICD-10-CM | POA: Diagnosis not present

## 2016-08-17 DIAGNOSIS — Z6835 Body mass index (BMI) 35.0-35.9, adult: Secondary | ICD-10-CM | POA: Diagnosis not present

## 2016-08-17 DIAGNOSIS — R339 Retention of urine, unspecified: Secondary | ICD-10-CM | POA: Diagnosis not present

## 2016-08-31 ENCOUNTER — Ambulatory Visit: Payer: Medicare Other | Admitting: Gastroenterology

## 2016-09-04 ENCOUNTER — Other Ambulatory Visit: Payer: Self-pay | Admitting: Gastroenterology

## 2016-11-13 DIAGNOSIS — R0982 Postnasal drip: Secondary | ICD-10-CM | POA: Diagnosis not present

## 2016-11-13 DIAGNOSIS — J029 Acute pharyngitis, unspecified: Secondary | ICD-10-CM | POA: Diagnosis not present

## 2016-11-16 IMAGING — CT CT ABD-PELV W/ CM
2 of 5 series · 15 of 46 positions shown, 17 images · IV contrast (ISOVUE 300)
Comparison: 06/30/2007

CLINICAL DATA: Lower abdominal pain for 2-3 months intermittently
with elevated liver function tests.

EXAM:
CT ABDOMEN AND PELVIS WITH CONTRAST
TECHNIQUE: Multidetector CT imaging of the abdomen and pelvis was performed
using the standard protocol following bolus administration of
intravenous contrast.
CONTRAST:  80mL 5D1TOY-2FF IOPAMIDOL (5D1TOY-2FF) INJECTION 61%

[Series 2: abd/ pelvis · axial · 0.80mm/px · z∈[-506,-42]mm · 12 of 105 slices shown, 14 images]
[im 6/105  soft-tissue]
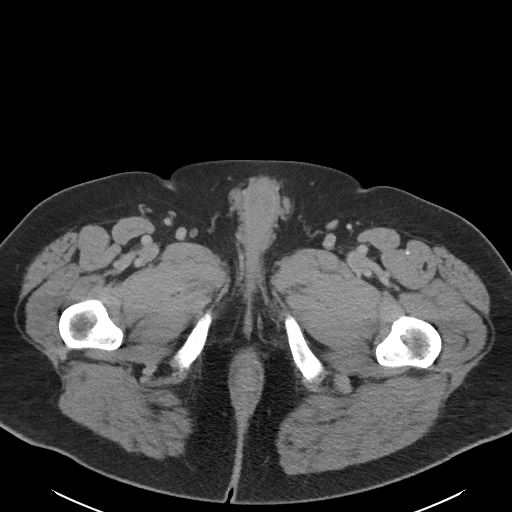
[im 6/105  bone]
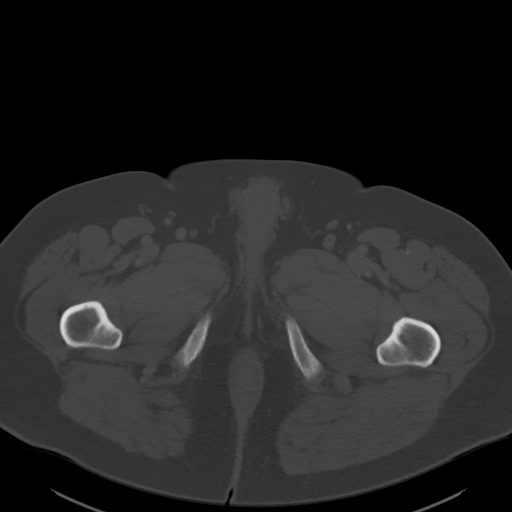
[im 18/105  soft-tissue]
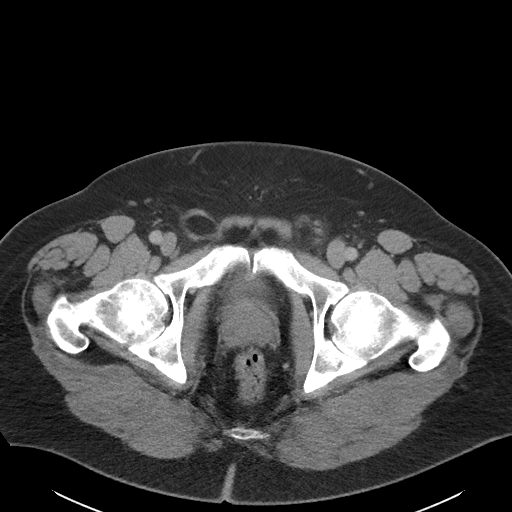
[im 24/105  soft-tissue]
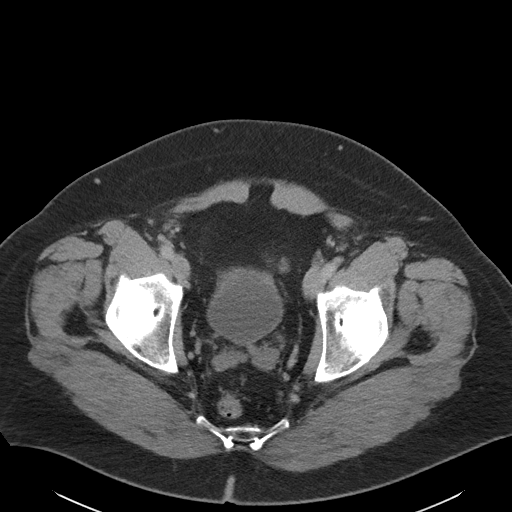
[im 29/105  soft-tissue]
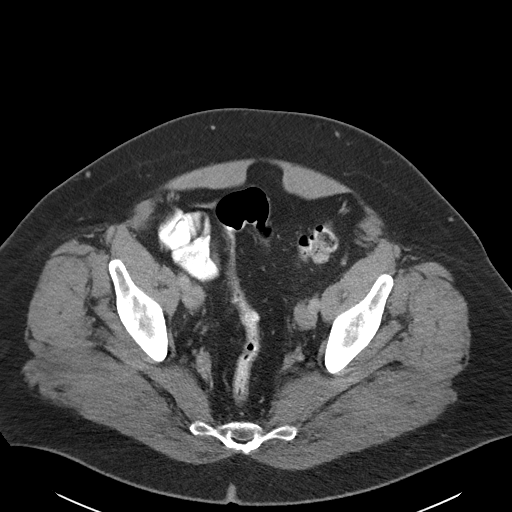
[im 41/105  soft-tissue]
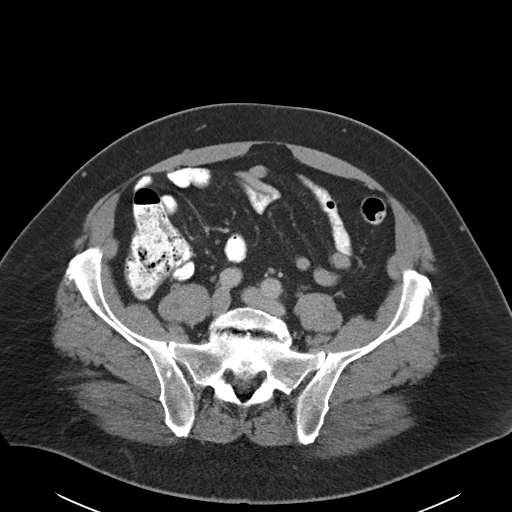
[im 47/105  soft-tissue]
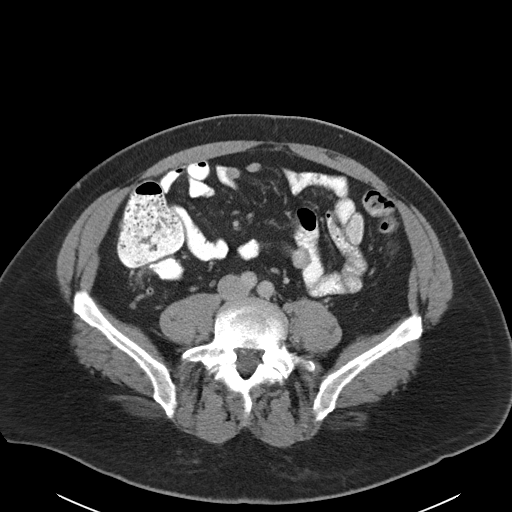
[im 58/105  soft-tissue]
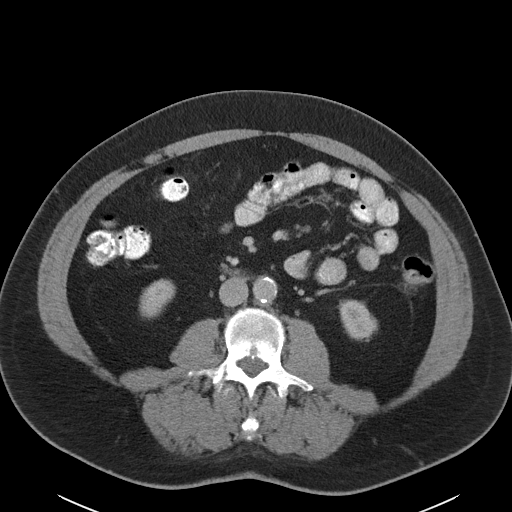
[im 64/105  soft-tissue]
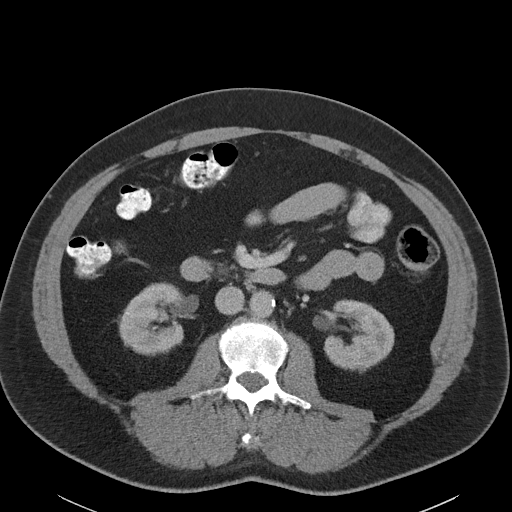
[im 76/105  soft-tissue]
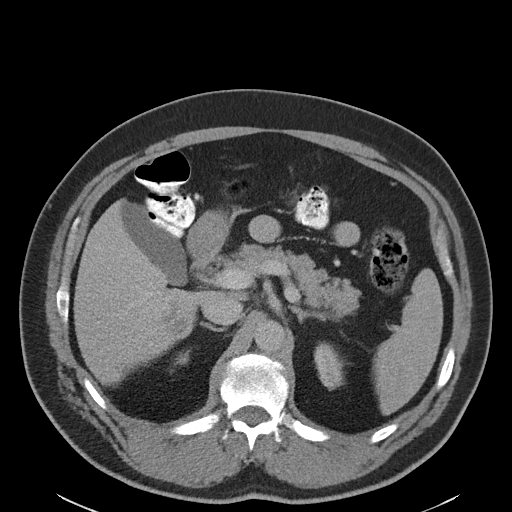
[im 76/105  bone]
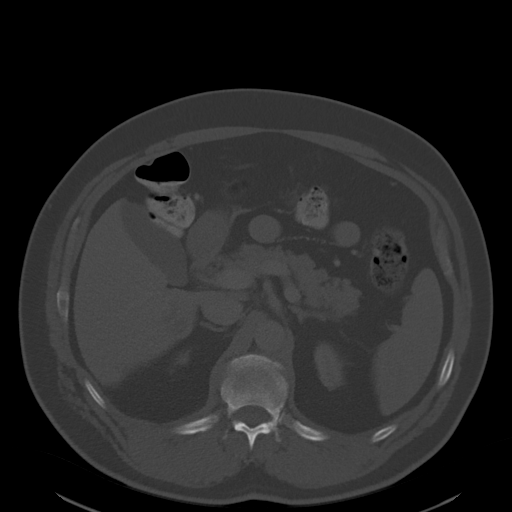
[im 81/105  soft-tissue]
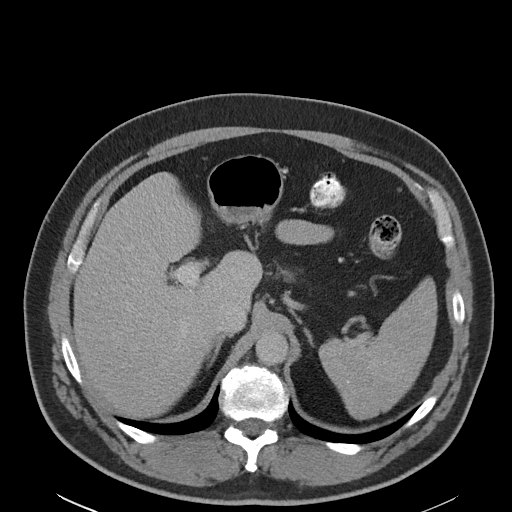
[im 87/105  soft-tissue]
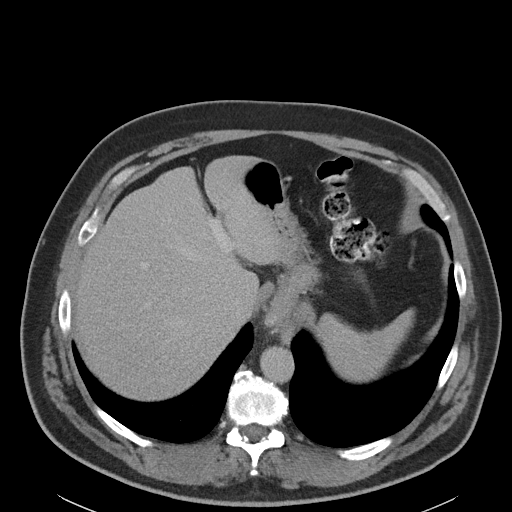
[im 99/105  soft-tissue]
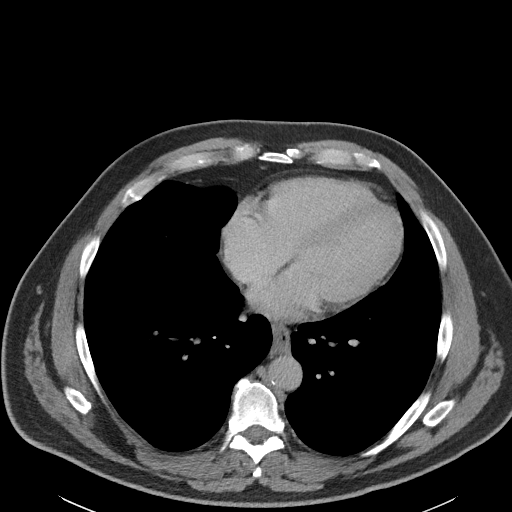

[Series 5: coronal soft tissue · coronal · 0.78mm/px · 3 of 102 slices shown]
[im 34/102  soft-tissue]
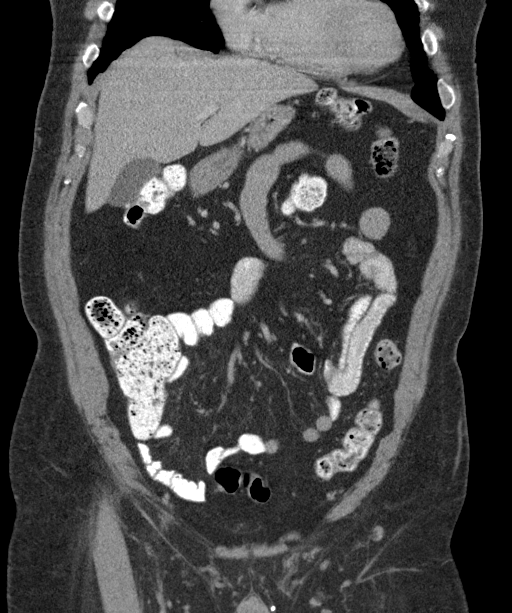
[im 45/102  soft-tissue]
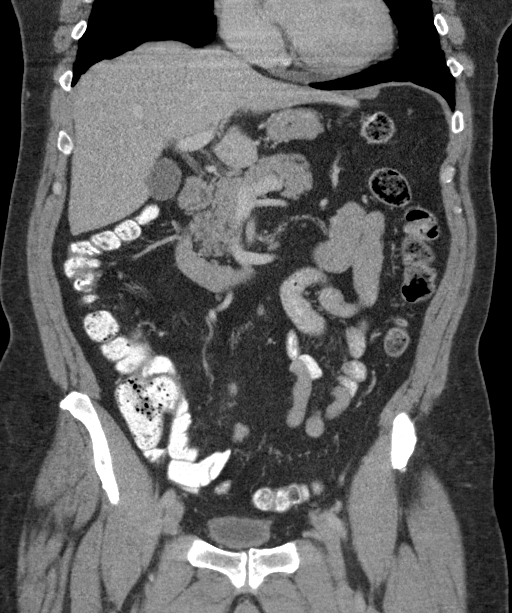
[im 57/102  soft-tissue]
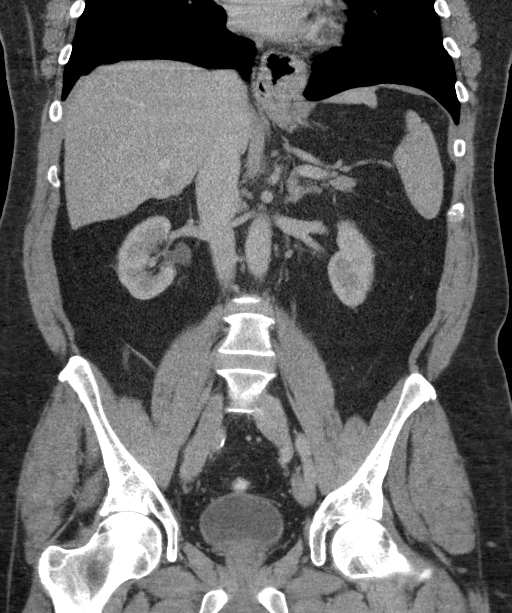

[15 of 46 positions shown; findings below may reference images not displayed]

FINDINGS: Lower chest: A lymph node in the right pericardial adipose tissue
measures 6 mm in short axis, accordingly considered benign.
Moderate-sized type 1 hiatal hernia.

Hepatobiliary: 3.7 by 2.8 cm partially exophytic mass
posteromedially in the right hepatic lobe, image [DATE]. Faint
hypodensity in this vicinity on 06/30/2007 measuring 3.7 by 3.2 cm,
but much less conspicuous then compared to today' s exam due to the
lack of IV contrast. Gallbladder unremarkable. No biliary
dilatation.

Pancreas: Unremarkable

Spleen: Is unremarkable

Adrenals/Urinary Tract: Several small renal hypodense lesions
include a 1.1 cm lesion of the left kidney upper pole, probably a
cyst but technically nonspecific due to motion artifact and small
size. 2 mm right kidney lower pole nonobstructive calculus, image
63/5.

Stomach/Bowel: Unremarkable.

Vascular/Lymphatic: Aortoiliac atherosclerotic vascular disease. No
pathologic adenopathy identified.

Reproductive: Unremarkable

Other: No supplemental non-categorized findings.

Musculoskeletal: Small umbilical hernia contains adipose tissue.
Direct right inguinal hernia contains adipose tissue. 8 mm grade 1
anterolisthesis associated with bilateral chronic pars defects at
L5. There is resulting bilateral foraminal stenosis.
IMPRESSION: 1. A specific cause for lower abdominal pain is not observed.
2. There is a partially exophytic complex lesions/mass
posteromedially in the right hepatic lobe, segment 6. This probably
fills in with contrast on the delayed images but is blurred by
motion artifact on those images. More importantly, there was a
hypodense lesion of a similar size in this location on 06/30/2007.
The minimal (if any) change in size over the past 8 years favors a
benign process and hepatic hemangioma would be favored. Given the
elevated liver function tests, this could be definitively
characterized with hepatic protocol MRI with and without contrast if
clinically warranted, but I do not think that this is mandatory
given the size stability from 5550.
3. Other imaging findings of potential clinical significance:
Moderate-sized type 1 hiatal hernia. Nonobstructive right
nephrolithiasis. Probable left kidney upper pole cyst, although
technically nonspecific due to small size. Aortoiliac
atherosclerotic vascular disease. Small umbilical hernia contains
adipose tissue. Bilateral pars defects at L5 with grade 1
anterolisthesis and bilateral foraminal stenosis resulting. Small
direct right inguinal hernia contains adipose tissue.

## 2016-12-26 ENCOUNTER — Encounter: Payer: Self-pay | Admitting: Gastroenterology

## 2016-12-27 ENCOUNTER — Ambulatory Visit (AMBULATORY_SURGERY_CENTER): Payer: Self-pay | Admitting: *Deleted

## 2016-12-27 ENCOUNTER — Other Ambulatory Visit: Payer: Self-pay

## 2016-12-27 VITALS — Ht 70.0 in | Wt 261.6 lb

## 2016-12-27 DIAGNOSIS — Z1211 Encounter for screening for malignant neoplasm of colon: Secondary | ICD-10-CM

## 2016-12-27 NOTE — Progress Notes (Signed)
No egg or soy allergy  No intubation or anesthesia problems per pt  No home oxygen used of hx of sleep apnea  No diet medications taken  Pt had MAC with EGD and had no problems  Plenvu sample given to pt d/t his having Medicare insurance

## 2016-12-31 DIAGNOSIS — H2513 Age-related nuclear cataract, bilateral: Secondary | ICD-10-CM | POA: Diagnosis not present

## 2017-01-02 ENCOUNTER — Other Ambulatory Visit: Payer: Self-pay

## 2017-01-02 ENCOUNTER — Ambulatory Visit (AMBULATORY_SURGERY_CENTER): Payer: Medicare Other | Admitting: Gastroenterology

## 2017-01-02 ENCOUNTER — Encounter: Payer: Self-pay | Admitting: Gastroenterology

## 2017-01-02 VITALS — BP 118/85 | HR 77 | Temp 98.0°F | Resp 17 | Ht 70.0 in | Wt 261.0 lb

## 2017-01-02 DIAGNOSIS — D123 Benign neoplasm of transverse colon: Secondary | ICD-10-CM | POA: Diagnosis not present

## 2017-01-02 DIAGNOSIS — D122 Benign neoplasm of ascending colon: Secondary | ICD-10-CM | POA: Diagnosis not present

## 2017-01-02 DIAGNOSIS — Z1212 Encounter for screening for malignant neoplasm of rectum: Secondary | ICD-10-CM

## 2017-01-02 DIAGNOSIS — D120 Benign neoplasm of cecum: Secondary | ICD-10-CM

## 2017-01-02 DIAGNOSIS — Z1211 Encounter for screening for malignant neoplasm of colon: Secondary | ICD-10-CM | POA: Diagnosis present

## 2017-01-02 DIAGNOSIS — Z86010 Personal history of colonic polyps: Secondary | ICD-10-CM | POA: Diagnosis not present

## 2017-01-02 MED ORDER — SODIUM CHLORIDE 0.9 % IV SOLN: 500.0000 mL | INTRAVENOUS | Status: AC

## 2017-01-02 NOTE — Progress Notes (Signed)
Called to room to assist during endoscopic procedure.  Patient ID and intended procedure confirmed with present staff. Received instructions for my participation in the procedure from the performing physician.  

## 2017-01-02 NOTE — Patient Instructions (Signed)
Discharge instructions given. Handouts on polyps and hemorrhoids. Resume previous medications. No ibuprofen, naproxen, or other NSAIDs for two weeks. YOU HAD AN ENDOSCOPIC PROCEDURE TODAY AT THE [facility]:   Refer to the procedure report that was given to you for any specific questions about what was found during the examination.  If the procedure report does not answer your questions, please call your gastroenterologist to clarify.  If you requested that your care partner not be given the details of your procedure findings, then the procedure report has been included in a sealed envelope for you to review at your convenience later.  YOU SHOULD EXPECT: Some feelings of bloating in the abdomen. Passage of more gas than usual.  Walking can help get rid of the air that was put into your GI tract during the procedure and reduce the bloating. If you had a lower endoscopy (such as a colonoscopy or flexible sigmoidoscopy) you may notice spotting of blood in your stool or on the toilet paper. If you underwent a bowel prep for your procedure, you may not have a normal bowel movement for a few days.  Please Note:  You might notice some irritation and congestion in your nose or some drainage.  This is from the oxygen used during your procedure.  There is no need for concern and it should clear up in a day or so.  SYMPTOMS TO REPORT IMMEDIATELY:   Following lower endoscopy (colonoscopy or flexible sigmoidoscopy):  Excessive amounts of blood in the stool  Significant tenderness or worsening of abdominal pains  Swelling of the abdomen that is new, acute  Fever of 100F or higher   For urgent or emergent issues, a gastroenterologist can be reached at any hour by calling [phone].   DIET:  We do recommend a small meal at first, but then you may proceed to your regular diet.  Drink plenty of fluids but you should avoid alcoholic beverages for 24 hours.  ACTIVITY:  You should plan to take  it easy for the rest of today and you should NOT DRIVE or use heavy machinery until tomorrow (because of the sedation medicines used during the test).    FOLLOW UP: Our staff will call the number listed on your records the next business day following your procedure to check on you and address any questions or concerns that you may have regarding the information given to you following your procedure. If we do not reach you, we will leave a message.  However, if you are feeling well and you are not experiencing any problems, there is no need to return our call.  We will assume that you have returned to your regular daily activities without incident.  If any biopsies were taken you will be contacted by phone or by letter within the next 1-3 weeks.  Please call us at [phone] if you have not heard about the biopsies in 3 weeks.    SIGNATURES/CONFIDENTIALITY: You and/or your care partner have signed paperwork which will be entered into your electronic medical record.  These signatures attest to the fact that that the information above on your After Visit Summary has been reviewed and is understood.  Full responsibility of the confidentiality of this discharge information lies with you and/or your care-partner.  

## 2017-01-02 NOTE — Op Note (Signed)
[facility] Patient Name: Mario Stevenson Procedure Date: 01/02/2017 1:49 PM MRN: [id number] Endoscopist: Remo Lipps P. Lynnetta Tom MD, MD Age: 71 Referring MD:  Date of Birth: Nov 21, 1945 Gender: Male Account #: [id number] Procedure:                Colonoscopy Indications:              Screening for colorectal malignant neoplasm Medicines:                Monitored Anesthesia Care Procedure:                Pre-Anesthesia Assessment:                           - Prior to the procedure, a History and Physical                            was performed, and patient medications and                            allergies were reviewed. The patient's tolerance of                            previous anesthesia was also reviewed. The risks                            and benefits of the procedure and the sedation                            options and risks were discussed with the patient.                            All questions were answered, and informed consent                            was obtained. Prior Anticoagulants: The patient has                            taken no previous anticoagulant or antiplatelet                            agents. ASA Grade Assessment: I - A normal, healthy                            patient. After reviewing the risks and benefits,                            the patient was deemed in satisfactory condition to                            undergo the procedure.                           After obtaining informed consent, the colonoscope  was passed under direct vision. Throughout the                            procedure, the patient's blood pressure, pulse, and                            oxygen saturations were monitored continuously. The                            Colonoscope was introduced through the anus and                            advanced to the the cecum, identified by                            appendiceal orifice and  ileocecal valve. The                            colonoscopy was performed without difficulty. The                            patient tolerated the procedure well. The quality                            of the bowel preparation was good. The ileocecal                            valve, appendiceal orifice, and rectum were                            photographed. Scope In: 2:02:03 PM Scope Out: 2:24:32 PM Scope Withdrawal Time: 0 hours 17 minutes 23 seconds  Total Procedure Duration: 0 hours 22 minutes 29 seconds  Findings:                 The perianal and digital rectal examinations were                            normal.                           There was a medium-sized lipoma, in the cecum.                           A 7 mm polyp was found in the cecum. The polyp was                            sessile. The polyp was removed with a cold snare.                            Resection and retrieval were complete.                           A 4 mm polyp was found in the ascending colon. The  polyp was sessile. The polyp was removed with a                            cold snare. Resection and retrieval were complete.                           A 6 mm polyp was found in the hepatic flexure. The                            polyp was sessile. The polyp was removed with a                            cold snare. Resection and retrieval were complete.                           Three sessile polyps were found in the transverse                            colon. The polyps were 3 to 5 mm in size. These                            polyps were removed with a cold snare. Resection                            and retrieval were complete.                           A 3 mm polyp was found in the descending colon. The                            polyp was sessile. The polyp was removed with a                            cold snare. Resection and retrieval were complete.                            Internal hemorrhoids were found during retroflexion.                           The exam was otherwise without abnormality. Complications:            No immediate complications. Estimated blood loss:                            Minimal. Estimated Blood Loss:     Estimated blood loss was minimal. Impression:               - Medium-sized lipoma in the cecum.                           - One 7 mm polyp in the cecum, removed with a cold  snare. Resected and retrieved.                           - One 4 mm polyp in the ascending colon, removed                            with a cold snare. Resected and retrieved.                           - One 6 mm polyp at the hepatic flexure, removed                            with a cold snare. Resected and retrieved.                           - Three 3 to 5 mm polyps in the transverse colon,                            removed with a cold snare. Resected and retrieved.                           - One 3 mm polyp in the descending colon, removed                            with a cold snare. Resected and retrieved.                           - Internal hemorrhoids.                           - The examination was otherwise normal. Recommendation:           - Patient has a contact number available for                            emergencies. The signs and symptoms of potential                            delayed complications were discussed with the                            patient. Return to normal activities tomorrow.                            Written discharge instructions were provided to the                            patient.                           - Resume previous diet.                           - Continue present medications.                           -  Await pathology results.                           - Repeat colonoscopy is recommended for                            surveillance. The colonoscopy date will be                             determined after pathology results from today's                            exam become available for review.                           - No ibuprofen, naproxen, or other non-steroidal                            anti-inflammatory drugs for 2 weeks after polyp                            removal. Remo Lipps P. Naveya Ellerman MD, MD 01/02/2017 2:29:47 PM This report has been signed electronically.

## 2017-01-02 NOTE — Progress Notes (Signed)
Report given to PACU, vss 

## 2017-01-03 ENCOUNTER — Telehealth: Payer: Self-pay

## 2017-01-03 NOTE — Telephone Encounter (Signed)
  Follow up Call-  Call back number 01/02/2017 07/20/2016  Post procedure Call Back phone  # [phone]  Permission to leave phone message Yes Yes  Some recent data might be hidden     Patient questions:  Do you have a fever, pain , or abdominal swelling? No. Pain Score  0 *  Have you tolerated food without any problems? Yes.    Have you been able to return to your normal activities? Yes.    Do you have any questions about your discharge instructions: Diet   No. Medications  No. Follow up visit  No.  Do you have questions or concerns about your Care? No.  Actions: * If pain score is 4 or above: No action needed, pain <4.

## 2017-01-10 ENCOUNTER — Encounter: Payer: Self-pay | Admitting: Gastroenterology

## 2017-03-18 ENCOUNTER — Other Ambulatory Visit: Payer: Self-pay

## 2017-03-18 MED ORDER — OMEPRAZOLE 20 MG PO CPDR: 20.0000 mg | DELAYED_RELEASE_CAPSULE | Freq: Every day | ORAL | 3 refills | Status: DC

## 2017-06-14 DIAGNOSIS — N401 Enlarged prostate with lower urinary tract symptoms: Secondary | ICD-10-CM | POA: Diagnosis not present

## 2017-06-14 DIAGNOSIS — M109 Gout, unspecified: Secondary | ICD-10-CM | POA: Diagnosis not present

## 2017-06-14 DIAGNOSIS — E782 Mixed hyperlipidemia: Secondary | ICD-10-CM | POA: Diagnosis not present

## 2017-06-14 DIAGNOSIS — E039 Hypothyroidism, unspecified: Secondary | ICD-10-CM | POA: Diagnosis not present

## 2017-06-14 DIAGNOSIS — N183 Chronic kidney disease, stage 3 (moderate): Secondary | ICD-10-CM | POA: Diagnosis not present

## 2017-06-14 DIAGNOSIS — R609 Edema, unspecified: Secondary | ICD-10-CM | POA: Diagnosis not present

## 2017-06-14 DIAGNOSIS — K219 Gastro-esophageal reflux disease without esophagitis: Secondary | ICD-10-CM | POA: Diagnosis not present

## 2017-06-14 DIAGNOSIS — Z23 Encounter for immunization: Secondary | ICD-10-CM | POA: Diagnosis not present

## 2018-02-10 DIAGNOSIS — H2513 Age-related nuclear cataract, bilateral: Secondary | ICD-10-CM | POA: Diagnosis not present

## 2018-02-10 DIAGNOSIS — H0289 Other specified disorders of eyelid: Secondary | ICD-10-CM | POA: Diagnosis not present

## 2018-02-11 DIAGNOSIS — Z1159 Encounter for screening for other viral diseases: Secondary | ICD-10-CM | POA: Diagnosis not present

## 2018-02-11 DIAGNOSIS — R0602 Shortness of breath: Secondary | ICD-10-CM | POA: Diagnosis not present

## 2018-02-11 DIAGNOSIS — N401 Enlarged prostate with lower urinary tract symptoms: Secondary | ICD-10-CM | POA: Diagnosis not present

## 2018-02-11 DIAGNOSIS — E039 Hypothyroidism, unspecified: Secondary | ICD-10-CM | POA: Diagnosis not present

## 2018-02-11 DIAGNOSIS — M109 Gout, unspecified: Secondary | ICD-10-CM | POA: Diagnosis not present

## 2018-02-11 DIAGNOSIS — Z23 Encounter for immunization: Secondary | ICD-10-CM | POA: Diagnosis not present

## 2018-02-11 DIAGNOSIS — Z0000 Encounter for general adult medical examination without abnormal findings: Secondary | ICD-10-CM | POA: Diagnosis not present

## 2018-02-12 ENCOUNTER — Other Ambulatory Visit: Payer: Self-pay | Admitting: Family Medicine

## 2018-02-12 ENCOUNTER — Ambulatory Visit
Admission: RE | Admit: 2018-02-12 | Discharge: 2018-02-12 | Disposition: A | Discharge status: Home / Self Care | Payer: PPO | Source: Ambulatory Visit | Attending: Family Medicine | Admitting: Family Medicine

## 2018-02-12 DIAGNOSIS — K449 Diaphragmatic hernia without obstruction or gangrene: Secondary | ICD-10-CM | POA: Diagnosis not present

## 2018-02-12 DIAGNOSIS — R0602 Shortness of breath: Secondary | ICD-10-CM

## 2018-02-14 DIAGNOSIS — D229 Melanocytic nevi, unspecified: Secondary | ICD-10-CM | POA: Diagnosis not present

## 2018-02-14 DIAGNOSIS — L570 Actinic keratosis: Secondary | ICD-10-CM | POA: Diagnosis not present

## 2018-02-14 DIAGNOSIS — L821 Other seborrheic keratosis: Secondary | ICD-10-CM | POA: Diagnosis not present

## 2018-02-14 DIAGNOSIS — L814 Other melanin hyperpigmentation: Secondary | ICD-10-CM | POA: Diagnosis not present

## 2018-02-14 DIAGNOSIS — L820 Inflamed seborrheic keratosis: Secondary | ICD-10-CM | POA: Diagnosis not present

## 2018-05-19 DIAGNOSIS — E039 Hypothyroidism, unspecified: Secondary | ICD-10-CM | POA: Diagnosis not present

## 2018-12-12 DIAGNOSIS — Z20828 Contact with and (suspected) exposure to other viral communicable diseases: Secondary | ICD-10-CM | POA: Diagnosis not present

## 2019-01-16 DIAGNOSIS — I2510 Atherosclerotic heart disease of native coronary artery without angina pectoris: Secondary | ICD-10-CM

## 2019-01-16 HISTORY — DX: Atherosclerotic heart disease of native coronary artery without angina pectoris: I25.10

## 2019-01-19 DIAGNOSIS — H2513 Age-related nuclear cataract, bilateral: Secondary | ICD-10-CM | POA: Diagnosis not present

## 2019-01-19 DIAGNOSIS — H2511 Age-related nuclear cataract, right eye: Secondary | ICD-10-CM | POA: Diagnosis not present

## 2019-03-12 DIAGNOSIS — H2511 Age-related nuclear cataract, right eye: Secondary | ICD-10-CM | POA: Diagnosis not present

## 2019-03-13 DIAGNOSIS — H2512 Age-related nuclear cataract, left eye: Secondary | ICD-10-CM | POA: Diagnosis not present

## 2019-03-13 DIAGNOSIS — H25012 Cortical age-related cataract, left eye: Secondary | ICD-10-CM | POA: Diagnosis not present

## 2019-04-02 DIAGNOSIS — H2512 Age-related nuclear cataract, left eye: Secondary | ICD-10-CM | POA: Diagnosis not present

## 2019-07-27 ENCOUNTER — Other Ambulatory Visit: Payer: Self-pay

## 2019-07-27 ENCOUNTER — Emergency Department (HOSPITAL_BASED_OUTPATIENT_CLINIC_OR_DEPARTMENT_OTHER)
Admission: EM | Admit: 2019-07-27 | Discharge: 2019-07-27 | Disposition: A | Discharge status: Home / Self Care | Payer: PPO | Attending: Emergency Medicine | Admitting: Emergency Medicine

## 2019-07-27 ENCOUNTER — Encounter (HOSPITAL_BASED_OUTPATIENT_CLINIC_OR_DEPARTMENT_OTHER): Payer: Self-pay | Admitting: *Deleted

## 2019-07-27 ENCOUNTER — Emergency Department (HOSPITAL_BASED_OUTPATIENT_CLINIC_OR_DEPARTMENT_OTHER): Payer: PPO

## 2019-07-27 DIAGNOSIS — Z79890 Hormone replacement therapy (postmenopausal): Secondary | ICD-10-CM | POA: Insufficient documentation

## 2019-07-27 DIAGNOSIS — E039 Hypothyroidism, unspecified: Secondary | ICD-10-CM | POA: Diagnosis not present

## 2019-07-27 DIAGNOSIS — Z96651 Presence of right artificial knee joint: Secondary | ICD-10-CM | POA: Diagnosis not present

## 2019-07-27 DIAGNOSIS — N189 Chronic kidney disease, unspecified: Secondary | ICD-10-CM | POA: Diagnosis not present

## 2019-07-27 DIAGNOSIS — K449 Diaphragmatic hernia without obstruction or gangrene: Secondary | ICD-10-CM | POA: Diagnosis not present

## 2019-07-27 DIAGNOSIS — I10 Essential (primary) hypertension: Secondary | ICD-10-CM | POA: Diagnosis not present

## 2019-07-27 DIAGNOSIS — R0789 Other chest pain: Secondary | ICD-10-CM | POA: Diagnosis not present

## 2019-07-27 DIAGNOSIS — Z7982 Long term (current) use of aspirin: Secondary | ICD-10-CM | POA: Insufficient documentation

## 2019-07-27 LAB — CBC
HCT: 45.5 % (ref 39.0–52.0)
Hemoglobin: 14.9 g/dL (ref 13.0–17.0)
MCH: 31.1 pg (ref 26.0–34.0)
MCHC: 32.7 g/dL (ref 30.0–36.0)
MCV: 95 fL (ref 80.0–100.0)
Platelets: 205 10*3/uL (ref 150–400)
RBC: 4.79 MIL/uL (ref 4.22–5.81)
RDW: 14.2 % (ref 11.5–15.5)
WBC: 8.5 10*3/uL (ref 4.0–10.5)
nRBC: 0 % (ref 0.0–0.2)

## 2019-07-27 LAB — BASIC METABOLIC PANEL
Anion gap: 11 (ref 5–15)
BUN: 25 mg/dL — ABNORMAL HIGH (ref 8–23)
CO2: 24 mmol/L (ref 22–32)
Calcium: 8.9 mg/dL (ref 8.9–10.3)
Chloride: 106 mmol/L (ref 98–111)
Creatinine, Ser: 1.33 mg/dL — ABNORMAL HIGH (ref 0.61–1.24)
GFR calc Af Amer: >60 mL/min (ref >60)
GFR calc non Af Amer: 53 mL/min — ABNORMAL LOW (ref >60)
Glucose, Bld: 106 mg/dL — ABNORMAL HIGH (ref 70–99)
Potassium: 4.7 mmol/L (ref 3.5–5.1)
Sodium: 141 mmol/L (ref 135–145)

## 2019-07-27 LAB — TROPONIN I (HIGH SENSITIVITY)
Troponin I (High Sensitivity): 5 ng/L (ref <18)
Troponin I (High Sensitivity): 5 ng/L (ref <18)

## 2019-07-27 MED ORDER — SODIUM CHLORIDE 0.9% FLUSH
3.0000 mL | Freq: Once | INTRAVENOUS | Status: DC
  Filled 2019-07-27: qty 3

## 2019-07-27 NOTE — ED Triage Notes (Signed)
Chest pain described as heaviness x 2 days. He has been under a lot of stress recently. No cardiac hx.

## 2019-07-27 NOTE — Discharge Instructions (Signed)
Your heart enzyme tests are normal right now.  See cardiology for stress test if he has persistent pain.  Return to ER if you have worse chest pain, shortness of breath.

## 2019-07-27 NOTE — ED Notes (Signed)
Patient transported to X-ray 

## 2019-07-27 NOTE — ED Provider Notes (Signed)
[facility] EMERGENCY DEPARTMENT Provider Note   CSN: [id number] Arrival date & time: 07/27/19  1652     History Chief Complaint  Patient presents with  . Chest Pain    Mario Stevenson is a 74 y.o. male history of CKD, esophageal stricture, reflux, hyperlipidemia here presenting with chest pain.  Patient has intermittent chest heaviness and shoulder pain for the last 2 days. Patient states that he just sold his house and he is under a lot of stress because he is moving.  He has no history of heart disease and had a normal stress test several years ago .  Denies any recent travel or shortness of breath or productive cough or fever.  The history is provided by the patient.       Past Medical History:  Diagnosis Date  . Arthritis   . Chronic kidney disease    kidney stones  . Esophageal stricture   . GERD (gastroesophageal reflux disease)   . Hiatal hernia   . Hyperlipidemia    no per pt  . Hyperplastic colon polyp   . Hypothyroidism     Patient Active Problem List   Diagnosis Date Noted  . Status post right knee replacement 06/30/2015  . Osteoarthritis of right knee 06/29/2015  . ESOPHAGEAL STRICTURE 02/27/2010  . HIATAL HERNIA 02/27/2010  . GYNECOMASTIA, UNILATERAL 12/15/2009  . HYPOTHYROIDISM 12/06/2009  . HYPERLIPIDEMIA 12/06/2009  . GERD 12/06/2009  . ABDOMINAL PAIN, EPIGASTRIC 12/06/2009  . COLONIC POLYPS, HYPERPLASTIC, HX OF 12/06/2009    Past Surgical History:  Procedure Laterality Date  . ACHILLES TENDON REPAIR     right  . COLONOSCOPY    . 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 UPPER GASTROINTESTINAL ENDOSCOPY         Family History  Problem Relation Age of Onset  . Pancreatic cancer Father   . Diabetes Mother   . Heart disease Mother 6       CABG  . Heart attack Mother   . Diabetes Sister   . Heart attack Brother   . Heart disease Brother   . Colon cancer Neg Hx   . Esophageal cancer Neg Hx     . Rectal cancer Neg Hx   . Stomach cancer Neg Hx     Social History   Tobacco Use  . Smoking status: Never Smoker  . Smokeless tobacco: Never Used  Vaping Use  . Vaping Use: Never used  Substance Use Topics  . Alcohol use: No  . Drug use: No    Home Medications Prior to Admission medications   Medication Sig Start Date End Date Taking? Authorizing Provider  allopurinol (ZYLOPRIM) 100 MG tablet Take 100 mg by mouth daily.   Yes [provider]  levothyroxine (SYNTHROID, LEVOTHROID) 125 MCG tablet Take 150 mcg by mouth daily.    Yes [provider]  omeprazole (PRILOSEC) 20 MG capsule Take 1 capsule (20 mg total) by mouth daily. 03/18/17  Yes Armbruster, Carlota Raspberry, MD  aspirin EC 81 MG tablet Take 81 mg by mouth daily.    [provider]  COLCRYS 0.6 MG tablet  05/31/16   [provider]  doxazosin (CARDURA) 4 MG tablet Take 4 mg by mouth daily.  02/08/16   [provider]  furosemide (LASIX) 20 MG tablet Take 1 tablet (20 mg total) by mouth daily. 03/22/16 07/19/16  Jerline Pain, MD  tamsulosin (FLOMAX) 0.4 MG CAPS capsule Take  by mouth as needed. 07/02/15   [provider]    Allergies    Penicillins  Review of Systems   Review of Systems  Cardiovascular: Positive for chest pain.  All other systems reviewed and are negative.   Physical Exam Updated Vital Signs BP 135/82   Pulse 61   Temp 98.1 F (36.7 C) (Oral)   Resp 15   Ht 5\' 10"  (1.778 m)   Wt 106.6 kg   SpO2 100%   BMI 33.72 kg/m   Physical Exam Vitals and nursing note reviewed.  Constitutional:      Appearance: He is well-developed.  HENT:     Head: Normocephalic.  Eyes:     Pupils: Pupils are equal, round, and reactive to light.  Cardiovascular:     Rate and Rhythm: Normal rate and regular rhythm.     Heart sounds: Normal heart sounds.  Pulmonary:     Effort: Pulmonary effort is normal.     Breath sounds: Normal breath sounds.  Chest:     Chest  wall: No mass or tenderness.  Abdominal:     General: Bowel sounds are normal.     Palpations: Abdomen is soft.  Musculoskeletal:        General: Normal range of motion.     Cervical back: Normal range of motion and neck supple.  Skin:    General: Skin is warm.     Capillary Refill: Capillary refill takes less than 2 seconds.  Neurological:     General: No focal deficit present.     Mental Status: He is alert and oriented to person, place, and time.  Psychiatric:        Mood and Affect: Mood normal.        Behavior: Behavior normal.     ED Results / Procedures / Treatments   Labs (all labs ordered are listed, but only abnormal results are displayed) Labs Reviewed  BASIC METABOLIC PANEL - Abnormal; Notable for the following components:      Result Value   Glucose, Bld 106 (*)    BUN 25 (*)    Creatinine, Ser 1.33 (*)    GFR calc non Af Amer 53 (*)    All other components within normal limits  CBC  TROPONIN I (HIGH SENSITIVITY)  TROPONIN I (HIGH SENSITIVITY)    EKG EKG Interpretation  Date/Time:  Monday July 27 2019 16:59:37 EDT Ventricular Rate:  94 PR Interval:  176 QRS Duration: 76 QT Interval:  342 QTC Calculation: 427 R Axis:   80 Text Interpretation: Sinus rhythm with Premature supraventricular complexes and Premature ventricular complexes or Fusion complexes Otherwise normal ECG No previous ECGs available Confirmed by Wandra Arthurs [phone]) on 07/27/2019 7:58:36 PM   Radiology DG Chest 2 View  Result Date: 07/27/2019 CLINICAL DATA:  Left-sided chest tightness and pain for several days EXAM: CHEST - 2 VIEW COMPARISON:  02/12/2018 FINDINGS: Frontal and lateral views of the chest demonstrate an unremarkable cardiac silhouette. Stable hiatal hernia. No airspace disease, effusion, or pneumothorax. No acute bony abnormalities. IMPRESSION: 1. Stable exam, no acute process. Electronically Signed   By: Randa Ngo M.D.   On: 07/27/2019 17:18    Procedures Procedures  (including critical care time)  Medications Ordered in ED Medications  sodium chloride flush (NS) 0.9 % injection 3 mL (3 mLs Intravenous Not Given 07/27/19 1940)    ED Course  I have reviewed the triage vital signs and the nursing notes.  Pertinent labs &  imaging results that were available during my care of the patient were reviewed by me and considered in my medical decision making (see chart for details).    MDM Rules/Calculators/A&P                          Mario Stevenson is a 74 y.o. male here with chest pain.  He is under a lot of stress and pain has been going on for the last several days.  Not worse with exertion currently.  Patient has no known cardiac history.  Will get troponin x2 and chest x-ray and CBC and BMP  9:10 PM Troponin negative x2 and labs unremarkable.  Stable for discharge.  Final Clinical Impression(s) / ED Diagnoses Final diagnoses:  None    Rx / DC Orders ED Discharge Orders    None       Drenda Freeze, MD 07/27/19 2110

## 2019-08-17 ENCOUNTER — Ambulatory Visit: Payer: PPO | Admitting: Cardiology

## 2019-08-17 ENCOUNTER — Encounter: Payer: Self-pay | Admitting: Cardiology

## 2019-08-17 ENCOUNTER — Other Ambulatory Visit: Payer: Self-pay

## 2019-08-17 VITALS — BP 152/88 | HR 73 | Ht 70.0 in | Wt 243.0 lb

## 2019-08-17 DIAGNOSIS — I209 Angina pectoris, unspecified: Secondary | ICD-10-CM | POA: Insufficient documentation

## 2019-08-17 DIAGNOSIS — E785 Hyperlipidemia, unspecified: Secondary | ICD-10-CM

## 2019-08-17 DIAGNOSIS — I10 Essential (primary) hypertension: Secondary | ICD-10-CM | POA: Insufficient documentation

## 2019-08-17 DIAGNOSIS — R0609 Other forms of dyspnea: Secondary | ICD-10-CM | POA: Insufficient documentation

## 2019-08-17 DIAGNOSIS — R0600 Dyspnea, unspecified: Secondary | ICD-10-CM | POA: Diagnosis not present

## 2019-08-17 HISTORY — DX: Angina pectoris, unspecified: I20.9

## 2019-08-17 HISTORY — DX: Dyspnea, unspecified: R06.00

## 2019-08-17 HISTORY — DX: Other forms of dyspnea: R06.09

## 2019-08-17 HISTORY — DX: Essential (primary) hypertension: I10

## 2019-08-17 MED ORDER — METOPROLOL TARTRATE 25 MG PO TABS
25.0000 mg | ORAL_TABLET | Freq: Two times a day (BID) | ORAL | 1 refills | Status: DC
Start: 2019-08-17 — End: 2019-11-13

## 2019-08-17 MED ORDER — NITROGLYCERIN 0.4 MG SL SUBL
0.4000 mg | SUBLINGUAL_TABLET | SUBLINGUAL | 11 refills | Status: DC | PRN
Start: 2019-08-17 — End: 2023-05-16

## 2019-08-17 NOTE — Patient Instructions (Addendum)
Medication Instructions:  Your physician has recommended you make the following change in your medication:  START: Metoprolol tartrate 25 mg twice daily   TAKE AS NEEDED FOR CHEST PAIN: Nitroglycerin 0.4 mg sublingual (under your tongue) as needed for chest pain. If experiencing chest pain, stop what you are doing and sit down. Take 1 nitroglycerin and wait 5 minutes. If chest pain continues, take another nitroglycerin and wait 5 minutes. If chest pain does not subside, take 1 more nitroglycerin and dial 911. You make take a total of 3 nitroglycerin in a 15 minute time frame.  *If you need a refill on your cardiac medications before your next appointment, please call your pharmacy*   Lab Work: Your physician recommends that you return for lab work 3-7 days before ct: bmp   If you have labs (blood work) drawn today and your tests are completely normal, you will receive your results only by: Marland Kitchen MyChart Message (if you have MyChart) OR . A paper copy in the mail If you have any lab test that is abnormal or we need to change your treatment, we will call you to review the results.   Testing/Procedures: Your cardiac CT will be scheduled at one of the below locations:   [facility] [address] [place], [place] [address] [phone]  [place] [address] [place], [place] [address] [phone]  If scheduled at [facility], please arrive at the [facility] main entrance of [facility] 30 minutes prior to test start time. Proceed to the [facility] Radiology Department (first floor) to check-in and test prep.  If scheduled at [facility], please arrive 15 mins early for check-in and test prep.  Please follow these instructions carefully (unless otherwise directed):  Hold all erectile dysfunction medications at least 3 days (72 hrs) prior to test.  On the Night Before the  Test: . Be sure to Drink plenty of water. . Do not consume any caffeinated/decaffeinated beverages or chocolate 12 hours prior to your test. . Do not take any antihistamines 12 hours prior to your test.   On the Day of the Test: . Drink plenty of water. Do not drink any water within one hour of the test. . Do not eat any food 4 hours prior to the test. . You may take your regular medications prior to the test.  . Take metoprolol (Lopressor) two hours prior to test. . HOLD Furosemide morning of the test.         After the Test: . Drink plenty of water. . After receiving IV contrast, you may experience a mild flushed feeling. This is normal. . On occasion, you may experience a mild rash up to 24 hours after the test. This is not dangerous. If this occurs, you can take Benadryl 25 mg and increase your fluid intake. . If you experience trouble breathing, this can be serious. If it is severe call 911 IMMEDIATELY. If it is mild, please call our office. . If you take any of these medications: Glipizide/Metformin, Avandament, Glucavance, please do not take 48 hours after completing test unless otherwise instructed.   Once we have confirmed authorization from your insurance company, we will call you to set up a date and time for your test. Based on how quickly your insurance processes prior authorizations requests, please allow up to 4 weeks to be contacted for scheduling your Cardiac CT appointment. Be advised that  routine Cardiac CT appointments could be scheduled as many as 8 weeks after your provider has ordered it.  For non-scheduling related questions, please contact the cardiac imaging nurse navigator should you have any questions/concerns: Marchia Bond, Cardiac Imaging Nurse Navigator Burley Saver, Interim Cardiac Imaging Nurse [place] [facility] Direct Office Dial: [phone]   For scheduling needs, including cancellations and rescheduling, please call  Vivien Rota at [phone], option [phone].     Follow-Up: At [facility], you and your health needs are our priority.  As part of our continuing mission to provide you with exceptional heart care, we have created designated Provider Care Teams.  These Care Teams include your primary Cardiologist (physician) and Advanced Practice Providers (APPs -  Physician Assistants and Nurse Practitioners) who all work together to provide you with the care you need, when you need it.  We recommend signing up for the patient portal called "MyChart".  Sign up information is provided on this After Visit Summary.  MyChart is used to connect with patients for Virtual Visits (Telemedicine).  Patients are able to view lab/test results, encounter notes, upcoming appointments, etc.  Non-urgent messages can be sent to your provider as well.   To learn more about what you can do with MyChart, go to [email or web address].    Your next appointment:   1 month(s)  The format for your next appointment:   In Person  Provider:   Jenne Campus, MD   Other Instructions  Nitroglycerin sublingual tablets What is this medicine? NITROGLYCERIN (nye troe GLI ser in) is a type of vasodilator. It relaxes blood vessels, increasing the blood and oxygen supply to your heart. This medicine is used to relieve chest pain caused by angina. It is also used to prevent chest pain before activities like climbing stairs, going outdoors in cold weather, or sexual activity. This medicine may be used for other purposes; ask your health care provider or pharmacist if you have questions. COMMON BRAND NAME(S): Nitroquick, Nitrostat, Nitrotab What should I tell my health care provider before I take this medicine? They need to know if you have any of these conditions:  anemia  head injury, recent stroke, or bleeding in the brain  liver disease  previous heart attack  an unusual or allergic reaction to nitroglycerin, other medicines, foods,  dyes, or preservatives  pregnant or trying to get pregnant  breast-feeding How should I use this medicine? Take this medicine by mouth as needed. At the first sign of an angina attack (chest pain or tightness) place one tablet under your tongue. You can also take this medicine 5 to 10 minutes before an event likely to produce chest pain. Follow the directions on the prescription label. Let the tablet dissolve under the tongue. Do not swallow whole. Replace the dose if you accidentally swallow it. It will help if your mouth is not dry. Saliva around the tablet will help it to dissolve more quickly. Do not eat or drink, smoke or chew tobacco while a tablet is dissolving. If you are not better within 5 minutes after taking ONE dose of nitroglycerin, call 9-1-1 immediately to seek emergency medical care. Do not take more than 3 nitroglycerin tablets over 15 minutes. If you take this medicine often to relieve symptoms of angina, your doctor or health care professional may provide you with different instructions to manage your symptoms. If symptoms do not go away after following these instructions, it is important to call 9-1-1 immediately. Do not take more  than 3 nitroglycerin tablets over 15 minutes. Talk to your pediatrician regarding the use of this medicine in children. Special care may be needed. Overdosage: If you think you have taken too much of this medicine contact a poison control center or emergency room at once. NOTE: This medicine is only for you. Do not share this medicine with others. What if I miss a dose? This does not apply. This medicine is only used as needed. What may interact with this medicine? Do not take this medicine with any of the following medications:  certain migraine medicines like ergotamine and dihydroergotamine (DHE)  medicines used to treat erectile dysfunction like sildenafil, tadalafil, and vardenafil  riociguat This medicine may also interact with the following  medications:  alteplase  aspirin  heparin  medicines for high blood pressure  medicines for mental depression  other medicines used to treat angina  phenothiazines like chlorpromazine, mesoridazine, prochlorperazine, thioridazine This list may not describe all possible interactions. Give your health care provider a list of all the medicines, herbs, non-prescription drugs, or dietary supplements you use. Also tell them if you smoke, drink alcohol, or use illegal drugs. Some items may interact with your medicine. What should I watch for while using this medicine? Tell your doctor or health care professional if you feel your medicine is no longer working. Keep this medicine with you at all times. Sit or lie down when you take your medicine to prevent falling if you feel dizzy or faint after using it. Try to remain calm. This will help you to feel better faster. If you feel dizzy, take several deep breaths and lie down with your feet propped up, or bend forward with your head resting between your knees. You may get drowsy or dizzy. Do not drive, use machinery, or do anything that needs mental alertness until you know how this drug affects you. Do not stand or sit up quickly, especially if you are an older patient. This reduces the risk of dizzy or fainting spells. Alcohol can make you more drowsy and dizzy. Avoid alcoholic drinks. Do not treat yourself for coughs, colds, or pain while you are taking this medicine without asking your doctor or health care professional for advice. Some ingredients may increase your blood pressure. What side effects may I notice from receiving this medicine? Side effects that you should report to your doctor or health care professional as soon as possible:  blurred vision  dry mouth  skin rash  sweating  the feeling of extreme pressure in the head  unusually weak or tired Side effects that usually do not require medical attention (report to your doctor or  health care professional if they continue or are bothersome):  flushing of the face or neck  headache  irregular heartbeat, palpitations  nausea, vomiting This list may not describe all possible side effects. Call your doctor for medical advice about side effects. You may report side effects to FDA at 1-800-FDA-1088. Where should I keep my medicine? Keep out of the reach of children. Store at room temperature between 20 and 25 degrees C (68 and 77 degrees F). Store in [facility]. Protect from light and moisture. Keep tightly closed. Throw away any unused medicine after the expiration date. NOTE: This sheet is a summary. It may not cover all possible information. If you have questions about this medicine, talk to your doctor, pharmacist, or health care provider.  2020 Elsevier/Gold Standard (2012-10-30 17:57:36)  Metoprolol Extended-Release Tablets What is this medicine? METOPROLOL (  me TOE proe lole) is a beta blocker. It decreases the amount of work your heart has to do and helps your heart beat regularly. It treats high blood pressure and/or prevent chest pain (also called angina). It also treats heart failure. This medicine may be used for other purposes; ask your health care provider or pharmacist if you have questions. COMMON BRAND NAME(S): toprol, Toprol XL What should I tell my health care provider before I take this medicine? They need to know if you have any of these conditions:  diabetes  heart or vessel disease like slow heart rate, worsening heart failure, heart block, sick sinus syndrome or Raynaud's disease  kidney disease  liver disease  lung or breathing disease, like asthma or emphysema  pheochromocytoma  thyroid disease  an unusual or allergic reaction to metoprolol, other beta-blockers, medicines, foods, dyes, or preservatives  pregnant or trying to get pregnant  breast-feeding How should I use this medicine? Take this drug by mouth. Take it as  directed on the prescription label at the same time every day. Take it with food. You may cut the tablet in half if it is scored (has a line in the middle of it). This may help you swallow the tablet if the whole tablet is too big. Be sure to take both halves. Do not take just one-half of the tablet. Keep taking it unless your health care provider tells you to stop. Talk to your health care provider about the use of this drug in children. While it may be prescribed for children as young as 6 for selected conditions, precautions do apply. Overdosage: If you think you have taken too much of this medicine contact a poison control center or emergency room at once. NOTE: This medicine is only for you. Do not share this medicine with others. What if I miss a dose? If you miss a dose, take it as soon as you can. If it is almost time for your next dose, take only that dose. Do not take double or extra doses. What may interact with this medicine? This medicine may interact with the following medications:  certain medicines for blood pressure, heart disease, irregular heart beat  certain medicines for depression, like monoamine oxidase (MAO) inhibitors, fluoxetine, or paroxetine  clonidine  dobutamine  epinephrine  isoproterenol  reserpine This list may not describe all possible interactions. Give your health care provider a list of all the medicines, herbs, non-prescription drugs, or dietary supplements you use. Also tell them if you smoke, drink alcohol, or use illegal drugs. Some items may interact with your medicine. What should I watch for while using this medicine? Visit your doctor or health care professional for regular check ups. Contact your doctor right away if your symptoms worsen. Check your blood pressure and pulse rate regularly. Ask your health care professional what your blood pressure and pulse rate should be, and when you should contact them. You may get drowsy or dizzy. Do not  drive, use machinery, or do anything that needs mental alertness until you know how this medicine affects you. Do not sit or stand up quickly, especially if you are an older patient. This reduces the risk of dizzy or fainting spells. Contact your doctor if these symptoms continue. Alcohol may interfere with the effect of this medicine. Avoid alcoholic drinks. This medicine may increase blood sugar. Ask your healthcare provider if changes in diet or medicines are needed if you have diabetes. What side effects may I notice from  receiving this medicine? Side effects that you should report to your doctor or health care professional as soon as possible:  allergic reactions like skin rash, itching or hives  cold or numb hands or feet  depression  difficulty breathing  faint  fever with sore throat  irregular heartbeat, chest pain  rapid weight gain   signs and symptoms of high blood sugar such as being more thirsty or hungry or having to urinate more than normal. You may also feel very tired or have blurry vision.  swollen legs or ankles Side effects that usually do not require medical attention (report to your doctor or health care professional if they continue or are bothersome):  anxiety or nervousness  change in sex drive or performance  dry skin  headache  nightmares or trouble sleeping  short term memory loss  stomach upset or diarrhea This list may not describe all possible side effects. Call your doctor for medical advice about side effects. You may report side effects to FDA at 1-800-FDA-1088. Where should I keep my medicine? Keep out of the reach of children and pets. Store at room temperature between 20 and 25 degrees C (68 and 77 degrees F). Throw away any unused drug after the expiration date. NOTE: This sheet is a summary. It may not cover all possible information. If you have questions about this medicine, talk to your doctor, pharmacist, or health care  provider.  2020 Elsevier/Gold Standard (2018-08-14 18:23:00)   Cardiac CT Angiogram A cardiac CT angiogram is a procedure to look at the heart and the area around the heart. It may be done to help find the cause of chest pains or other symptoms of heart disease. During this procedure, a substance called contrast dye is injected into the blood vessels in the area to be checked. A large X-ray machine, called a CT scanner, then takes detailed pictures of the heart and the surrounding area. The procedure is also sometimes called a coronary CT angiogram, coronary artery scanning, or CTA. A cardiac CT angiogram allows the health care provider to see how well blood is flowing to and from the heart. The health care provider will be able to see if there are any problems, such as:  Blockage or narrowing of the coronary arteries in the heart.  Fluid around the heart.  Signs of weakness or disease in the muscles, valves, and tissues of the heart. Tell a health care provider about:  Any allergies you have. This is especially important if you have had a previous allergic reaction to contrast dye.  All medicines you are taking, including vitamins, herbs, eye drops, creams, and over-the-counter medicines.  Any blood disorders you have.  Any surgeries you have had.  Any medical conditions you have.  Whether you are pregnant or may be pregnant.  Any anxiety disorders, chronic pain, or other conditions you have that may increase your stress or prevent you from lying still. What are the risks? Generally, this is a safe procedure. However, problems may occur, including:  Bleeding.  Infection.  Allergic reactions to medicines or dyes.  Damage to other structures or organs.  Kidney damage from the contrast dye that is used.  Increased risk of cancer from radiation exposure. This risk is low. Talk with your health care provider about: ? The risks and benefits of testing. ? How you can receive the  lowest dose of radiation. What happens before the procedure?  Wear comfortable clothing and remove any jewelry, glasses, dentures, and  hearing aids.  Follow instructions from your health care provider about eating and drinking. This may include: ? For 12 hours before the procedure -- avoid caffeine. This includes tea, coffee, soda, energy drinks, and diet pills. Drink plenty of water or other fluids that do not have caffeine in them. Being well hydrated can prevent complications. ? For 4-6 hours before the procedure -- stop eating and drinking. The contrast dye can cause nausea, but this is less likely if your stomach is empty.  Ask your health care provider about changing or stopping your regular medicines. This is especially important if you are taking diabetes medicines, blood thinners, or medicines to treat problems with erections (erectile dysfunction). What happens during the procedure?   Hair on your chest may need to be removed so that small sticky patches called electrodes can be placed on your chest. These will transmit information that helps to monitor your heart during the procedure.  An IV will be inserted into one of your veins.  You might be given a medicine to control your heart rate during the procedure. This will help to ensure that good images are obtained.  You will be asked to lie on an exam table. This table will slide in and out of the CT machine during the procedure.  Contrast dye will be injected into the IV. You might feel warm, or you may get a metallic taste in your mouth.  You will be given a medicine called nitroglycerin. This will relax or dilate the arteries in your heart.  The table that you are lying on will move into the CT machine tunnel for the scan.  The person running the machine will give you instructions while the scans are being done. You may be asked to: ? Keep your arms above your head. ? Hold your breath. ? Stay very still, even if the table  is moving.  When the scanning is complete, you will be moved out of the machine.  The IV will be removed. The procedure may vary among health care providers and hospitals. What can I expect after the procedure? After your procedure, it is common to have:  A metallic taste in your mouth from the contrast dye.  A feeling of warmth.  A headache from the nitroglycerin. Follow these instructions at home:  Take over-the-counter and prescription medicines only as told by your health care provider.  If you are told, drink enough fluid to keep your urine pale yellow. This will help to flush the contrast dye out of your body.  Most people can return to their normal activities right after the procedure. Ask your health care provider what activities are safe for you.  It is up to you to get the results of your procedure. Ask your health care provider, or the department that is doing the procedure, when your results will be ready.  Keep all follow-up visits as told by your health care provider. This is important. Contact a health care provider if:  You have any symptoms of allergy to the contrast dye. These include: ? Shortness of breath. ? Rash or hives. ? A racing heartbeat. Summary  A cardiac CT angiogram is a procedure to look at the heart and the area around the heart. It may be done to help find the cause of chest pains or other symptoms of heart disease.  During this procedure, a large X-ray machine, called a CT scanner, takes detailed pictures of the heart and the surrounding area after  a contrast dye has been injected into blood vessels in the area.  Ask your health care provider about changing or stopping your regular medicines before the procedure. This is especially important if you are taking diabetes medicines, blood thinners, or medicines to treat erectile dysfunction.  If you are told, drink enough fluid to keep your urine pale yellow. This will help to flush the contrast  dye out of your body. This information is not intended to replace advice given to you by your health care provider. Make sure you discuss any questions you have with your health care provider. Document Revised: 08/27/2018 Document Reviewed: 08/27/2018 Elsevier Patient Education  [place].

## 2019-08-17 NOTE — Progress Notes (Signed)
Cardiology Consultation:    Date:  08/17/2019   ID:  Mario Stevenson, DOB 07-16-1945, MRN [id number]  PCP:  Shirline Frees, MD  Cardiologist:  Jenne Campus, MD   Referring MD: Shirline Frees, MD   No chief complaint on file. Have a chest pain  History of Present Illness:    Mario Stevenson is a 74 y.o. male who is being seen today for the evaluation of chest pain at the request of Shirline Frees, MD.  Past medical history significant for essential hypertension, dyslipidemia, comes to me because of chest pain.  He is selling his house and he is moving out it is a very stressful situation he described to have episode of chest heaviness and tightness that he grades 1 in scale up to 10 does happen typically with stressful situation interestingly because of the same time he can carry heavy boxes and work quite hard while moving and he has no difficulty doing it no chest pain during the time.  When he developed chest pain it lasted for few minutes.  It is relieved by rest.  He did not try nitroglycerin.  He ended going to the emergency room EKG showed no acute changes troponins were negative.  He started developing this problem few weeks ago.  Is not getting worse. His risk factors include hypertension however he does have this diagnosis he does have high blood pressure today in my office. Dyslipidemia however his LDL is 97 based on last test. He never smoked. He does not have family history of premature coronary artery disease.  Past Medical History:  Diagnosis Date  . Arthritis   . Chronic kidney disease    kidney stones  . Esophageal stricture   . GERD (gastroesophageal reflux disease)   . Hiatal hernia   . Hyperlipidemia    no per pt  . Hyperplastic colon polyp   . Hypothyroidism     Past Surgical History:  Procedure Laterality Date  . ACHILLES TENDON REPAIR     right  . COLONOSCOPY    . 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 UPPER  GASTROINTESTINAL ENDOSCOPY      Current Medications: Current Meds  Medication Sig  . allopurinol (ZYLOPRIM) 100 MG tablet Take 100 mg by mouth daily.  Marland Kitchen aspirin EC 81 MG tablet Take 81 mg by mouth daily.  Marland Kitchen COLCRYS 0.6 MG tablet   . doxazosin (CARDURA) 4 MG tablet Take 4 mg by mouth daily.   . DUREZOL 0.05 % EMUL SMARTSIG:Right Eye As Directed  . furosemide (LASIX) 20 MG tablet Take 1 tablet (20 mg total) by mouth daily.  Marland Kitchen HYDROcodone-acetaminophen (NORCO) 10-325 MG tablet Take 1 tablet by mouth daily.  Marland Kitchen levothyroxine (SYNTHROID) 150 MCG tablet Take 150 mcg by mouth daily.  Marland Kitchen levothyroxine (SYNTHROID, LEVOTHROID) 125 MCG tablet Take 150 mcg by mouth daily.   Marland Kitchen omeprazole (PRILOSEC) 20 MG capsule Take 1 capsule (20 mg total) by mouth daily.  Marland Kitchen PROLENSA 0.07 % SOLN SMARTSIG:1 Drop(s) Left Eye Every Evening  . tamsulosin (FLOMAX) 0.4 MG CAPS capsule Take by mouth as needed.   Current Facility-Administered Medications for the 08/17/19 encounter (Office Visit) with Park Liter, MD  Medication  . 0.9 %  sodium chloride infusion     Allergies:   Penicillins   Social History   Socioeconomic History  . Marital status: Married    Spouse name: Not on file  . Number of children: 3  .  Years of education: Not on file  . Highest education level: Not on file  Occupational History  . Occupation: self employed    Employer: [employer]  Tobacco Use  . Smoking status: Never Smoker  . Smokeless tobacco: Never Used  Vaping Use  . Vaping Use: Never used  Substance and Sexual Activity  . Alcohol use: No  . Drug use: No  . Sexual activity: Not on file  Other Topics Concern  . Not on file  Social History Narrative  . Not on file   Social Determinants of Health   Financial Resource Strain:   . Difficulty of Paying Living Expenses:   Food Insecurity:   . Worried About Charity fundraiser in the Last Year:   . Arboriculturist in the Last Year:   Transportation Needs:   .  Film/video editor (Medical):   Marland Kitchen Lack of Transportation (Non-Medical):   Physical Activity:   . Days of Exercise per Week:   . Minutes of Exercise per Session:   Stress:   . Feeling of Stress :   Social Connections:   . Frequency of Communication with Friends and Family:   . Frequency of Social Gatherings with Friends and Family:   . Attends Religious Services:   . Active Member of Clubs or Organizations:   . Attends Archivist Meetings:   Marland Kitchen Marital Status:      Family History: The patient's family history includes Diabetes in his mother and sister; Heart attack in his brother and mother; Heart disease in his brother; Heart disease (age of onset: 42) in his mother; Pancreatic cancer in his father. There is no history of Colon cancer, Esophageal cancer, Rectal cancer, or Stomach cancer. ROS:   Please see the history of present illness.    All 14 point review of systems negative except as described per history of present illness.  EKGs/Labs/Other Studies Reviewed:    The following studies were reviewed today:   EKG:  EKG is  ordered today.  The ekg ordered today demonstrates normal sinus rhythm, normal P interval, normal QS complex duration morphology, no ST segment changes  Recent Labs: 07/27/2019: BUN 25; Creatinine, Ser 1.33; Hemoglobin 14.9; Platelets 205; Potassium 4.7; Sodium 141  Recent Lipid Panel No results found for: CHOL, TRIG, HDL, CHOLHDL, VLDL, LDLCALC, LDLDIRECT  Physical Exam:    VS:  BP (!) 152/88 (BP Location: Right Arm, Patient Position: Sitting, Cuff Size: Normal)   Pulse 73   Ht 5\' 10"  (1.778 m)   Wt (!) 243 lb (110.2 kg)   SpO2 97%   BMI 34.87 kg/m     Wt Readings from Last 3 Encounters:  08/17/19 (!) 243 lb (110.2 kg)  07/27/19 235 lb (106.6 kg)  01/02/17 261 lb (118.4 kg)     GEN:  Well nourished, well developed in no acute distress HEENT: Normal NECK: No JVD; No carotid bruits LYMPHATICS: No lymphadenopathy CARDIAC: RRR, no  murmurs, no rubs, no gallops RESPIRATORY:  Clear to auscultation without rales, wheezing or rhonchi  ABDOMEN: Soft, non-tender, non-distended MUSCULOSKELETAL:  No edema; No deformity  SKIN: Warm and dry NEUROLOGIC:  Alert and oriented x 3 PSYCHIATRIC:  Normal affect   ASSESSMENT:    1. Dyslipidemia   2. Angina pectoris ([place])   3. Dyspnea on exertion   4. Essential hypertension    PLAN:    In order of problems listed above:  1. Angina pectoris.  Story is very concerning.  We  did talk about options in the situation including cardiac catheterization.  He prefers medical therapy first.  Therefore, I will put him on metoprolol 25 twice daily, he is already on aspirin.  I will give him nitroglycerin as needed.  He be scheduled to have coronary CT angio, however first we will check his Chem-7.  I give him instruction about nitroglycerin how to take it I also told him to go to the emergency if pain does not relieved by nitroglycerin. 2. Dyspnea on exertion again a stress test will be done to see if there is any angina equivalent. 3. Essential hypertension blood pressure elevated today during the office visit but this is the first visit.  Will start beta-blocker and see if that helps. 4. Dyslipidemia.  Will wait for results of stress testing before pursuing statin therapy.   Medication Adjustments/Labs and Tests Ordered: Current medicines are reviewed at length with the patient today.  Concerns regarding medicines are outlined above.  No orders of the defined types were placed in this encounter.  No orders of the defined types were placed in this encounter.   Signed, Park Liter, MD, [facility]. 08/17/2019 9:28 AM    [place]

## 2019-08-26 ENCOUNTER — Telehealth: Payer: Self-pay | Admitting: Cardiology

## 2019-08-26 NOTE — Telephone Encounter (Signed)
Left message for patient to return call.

## 2019-08-26 NOTE — Telephone Encounter (Signed)
Called and spoke to patient's wife per dpr advised her patient needs to have labs drawn 3-7 days before ct. She verbally understood. No further questions.

## 2019-08-26 NOTE — Telephone Encounter (Signed)
New Message:    Pt says he have been scheduled for his CT on 09-14-19. He wants to know when should he come in for his lab work?

## 2019-09-08 DIAGNOSIS — E785 Hyperlipidemia, unspecified: Secondary | ICD-10-CM | POA: Diagnosis not present

## 2019-09-08 DIAGNOSIS — I209 Angina pectoris, unspecified: Secondary | ICD-10-CM | POA: Diagnosis not present

## 2019-09-08 DIAGNOSIS — R0600 Dyspnea, unspecified: Secondary | ICD-10-CM | POA: Diagnosis not present

## 2019-09-08 DIAGNOSIS — I10 Essential (primary) hypertension: Secondary | ICD-10-CM | POA: Diagnosis not present

## 2019-09-09 LAB — BASIC METABOLIC PANEL
BUN/Creatinine Ratio: 16 (ref 10–24)
BUN: 18 mg/dL (ref 8–27)
CO2: 24 mmol/L (ref 20–29)
Calcium: 9.3 mg/dL (ref 8.6–10.2)
Chloride: 105 mmol/L (ref 96–106)
Creatinine, Ser: 1.13 mg/dL (ref 0.76–1.27)
GFR calc Af Amer: 74 mL/min/{1.73_m2} (ref >59)
GFR calc non Af Amer: 64 mL/min/{1.73_m2} (ref >59)
Glucose: 89 mg/dL (ref 65–99)
Potassium: 4.6 mmol/L (ref 3.5–5.2)
Sodium: 142 mmol/L (ref 134–144)

## 2019-09-11 ENCOUNTER — Telehealth (HOSPITAL_COMMUNITY): Payer: Self-pay | Admitting: Emergency Medicine

## 2019-09-11 NOTE — Telephone Encounter (Signed)
Reaching out to patient to offer assistance regarding upcoming cardiac imaging study; pt verbalizes understanding of appt date/time, parking situation and where to check in, pre-test NPO status and medications ordered, and verified current allergies; name and call back number provided for further questions should they arise Elyse Prevo RN Navigator Cardiac Imaging [facility] [phone] office [phone] cell 

## 2019-09-14 ENCOUNTER — Ambulatory Visit (HOSPITAL_COMMUNITY)
Admission: RE | Admit: 2019-09-14 | Discharge: 2019-09-14 | Disposition: A | Discharge status: Home / Self Care | Payer: PPO | Source: Ambulatory Visit | Attending: Cardiology | Admitting: Cardiology

## 2019-09-14 ENCOUNTER — Other Ambulatory Visit: Payer: Self-pay

## 2019-09-14 DIAGNOSIS — E785 Hyperlipidemia, unspecified: Secondary | ICD-10-CM

## 2019-09-14 DIAGNOSIS — R0609 Other forms of dyspnea: Secondary | ICD-10-CM

## 2019-09-14 DIAGNOSIS — R0600 Dyspnea, unspecified: Secondary | ICD-10-CM | POA: Diagnosis not present

## 2019-09-14 DIAGNOSIS — I209 Angina pectoris, unspecified: Secondary | ICD-10-CM | POA: Insufficient documentation

## 2019-09-14 DIAGNOSIS — I700 Atherosclerosis of aorta: Secondary | ICD-10-CM

## 2019-09-14 MED ORDER — NITROGLYCERIN 0.4 MG SL SUBL
0.8000 mg | SUBLINGUAL_TABLET | Freq: Once | SUBLINGUAL | Status: AC
  Administered 2019-09-14: 0.8 mg via SUBLINGUAL

## 2019-09-14 MED ORDER — IOHEXOL 350 MG/ML SOLN
80.0000 mL | Freq: Once | INTRAVENOUS | Status: AC | PRN
  Administered 2019-09-14: 80 mL via INTRAVENOUS

## 2019-09-14 MED ORDER — NITROGLYCERIN 0.4 MG SL SUBL
SUBLINGUAL_TABLET | SUBLINGUAL | Status: AC
  Filled 2019-09-14: qty 2

## 2019-09-15 ENCOUNTER — Telehealth: Payer: Self-pay | Admitting: Emergency Medicine

## 2019-09-15 NOTE — Telephone Encounter (Signed)
Patient needs an appointment with Dr. Agustin Cree. Left message for him to return call.

## 2019-09-16 ENCOUNTER — Ambulatory Visit (HOSPITAL_BASED_OUTPATIENT_CLINIC_OR_DEPARTMENT_OTHER)
Admission: RE | Admit: 2019-09-16 | Discharge: 2019-09-16 | Disposition: A | Discharge status: Home / Self Care | Payer: PPO | Source: Ambulatory Visit | Attending: Cardiology | Admitting: Cardiology

## 2019-09-16 ENCOUNTER — Other Ambulatory Visit: Payer: Self-pay

## 2019-09-16 ENCOUNTER — Encounter: Payer: Self-pay | Admitting: Cardiology

## 2019-09-16 ENCOUNTER — Ambulatory Visit: Payer: PPO | Admitting: Cardiology

## 2019-09-16 VITALS — BP 118/80 | HR 66 | Ht 70.0 in | Wt 244.0 lb

## 2019-09-16 DIAGNOSIS — R0609 Other forms of dyspnea: Secondary | ICD-10-CM

## 2019-09-16 DIAGNOSIS — I10 Essential (primary) hypertension: Secondary | ICD-10-CM | POA: Diagnosis not present

## 2019-09-16 DIAGNOSIS — I209 Angina pectoris, unspecified: Secondary | ICD-10-CM | POA: Insufficient documentation

## 2019-09-16 DIAGNOSIS — Z01818 Encounter for other preprocedural examination: Secondary | ICD-10-CM | POA: Diagnosis not present

## 2019-09-16 DIAGNOSIS — R0600 Dyspnea, unspecified: Secondary | ICD-10-CM

## 2019-09-16 NOTE — Patient Instructions (Signed)
Medication Instructions:  Your physician recommends that you continue on your current medications as directed. Please refer to the Current Medication list given to you today.  *If you need a refill on your cardiac medications before your next appointment, please call your pharmacy*   Lab Work: Your physician recommends that you return for lab work today: bmp, cbc   If you have labs (blood work) drawn today and your tests are completely normal, you will receive your results only by: Marland Kitchen MyChart Message (if you have MyChart) OR . A paper copy in the mail If you have any lab test that is abnormal or we need to change your treatment, we will call you to review the results.   Testing/Procedures: A chest x-ray takes a picture of the organs and structures inside the chest, including the heart, lungs, and blood vessels. This test can show several things, including, whether the heart is enlarges; whether fluid is building up in the lungs; and whether pacemaker / defibrillator leads are still in place.     [place], [name] [address] Dept: [phone] Loc: [place]  09/16/2019  You are scheduled for a Cardiac Catheterization on Tuesday, September 7 with Dr. Shelva Majestic.  1. Please arrive at the [facility] (Main Entrance A) at [facility]: [address] [place], [place] [address] at 7:00 AM (This time is two hours before your procedure to ensure your preparation). Free valet parking service is available.   Special note: Every effort is made to have your procedure done on time. Please understand that emergencies sometimes delay scheduled procedures.  2. Diet: Do not eat solid foods after midnight.  The patient may have clear liquids until 5am upon the day of the procedure.  3. Labs: You will have labs drawn today.  4. Medication instructions in  preparation for your procedure:   Contrast Allergy: No    On the morning of your procedure, take your Aspirin and any morning medicines NOT listed above.  You may use sips of water.  5. Plan for one night stay--bring personal belongings. 6. Bring a current list of your medications and current insurance cards. 7. You MUST have a responsible person to drive you home. 8. Someone MUST be with you the first 24 hours after you arrive home or your discharge will be delayed. 9. Please wear clothes that are easy to get on and off and wear slip-on shoes.  Thank you for allowing [place] to care for you!   -- [facility]    Follow-Up: At [facility], you and your health needs are our priority.  As part of our continuing mission to provide you with exceptional heart care, we have created designated Provider Care Teams.  These Care Teams include your primary Cardiologist (physician) and Advanced Practice Providers (APPs -  Physician Assistants and Nurse Practitioners) who all work together to provide you with the care you need, when you need it.  We recommend signing up for the patient portal called "MyChart".  Sign up information is provided on this After Visit Summary.  MyChart is used to connect with patients for Virtual Visits (Telemedicine).  Patients are able to view lab/test results, encounter notes, upcoming appointments, etc.  Non-urgent messages can be sent to your provider as well.   To learn more about what you can do with MyChart, go to [email or web address].  Your next appointment:   1 month(s)  The format for your next appointment:   In Person  Provider:   Jenne Campus, MD   Other Instructions   Chest X-Ray A chest X-ray is a painless test that uses radiation to create images of the structures inside of your chest. Chest X-rays are used to look for many health conditions, including heart failure, pneumonia, tuberculosis, rib fractures,  breathing disorders, and cancer. They may be used to diagnose chest pain, constant coughing, or trouble breathing. Tell a health care provider about:  Any allergies you have.  All medicines you are taking, including vitamins, herbs, eye drops, creams, and over-the-counter medicines.  Any surgeries you have had.  Any medical conditions you have.  Whether you are pregnant or may be pregnant. What are the risks? Getting a chest X-ray is a safe procedure. However, you will be exposed to a small amount of radiation. Being exposed to too much radiation over a lifetime can increase the risk of cancer. This risk is small, but it may occur if you have many X-rays throughout your life. What happens before the procedure?  You may be asked to remove glasses, jewelry, and any other metal objects.  You will be asked to undress from the waist up. You may be given a hospital gown to wear.  You may be asked to wear a protective lead apron to protect parts of your body from radiation. What happens during the procedure?   You will be asked to stand still as each picture is taken to get the best possible images.  You will be asked to take a deep breath and hold your breath for a few seconds.  The X-ray machine will create a picture of your chest using a tiny burst of radiation. This is painless.  More pictures may be taken from other angles. Typically, one picture will be taken while you face the X-ray camera, and another picture will be taken from the side while you stand. If you cannot stand, you may be asked to lie down. The procedure may vary among health care providers and hospitals. What happens after the procedure?  The X-ray(s) will be reviewed by your health care provider or an X-ray (radiology) specialist.  It is up to you to get your test results. Ask your health care provider, or the department that is doing the test, when your results will be ready.  Your health care provider will  tell you if you need more tests or a follow-up exam. Keep all follow-up visits as told by your health care provider. This is important. Summary  A chest X-ray is a safe, painless test that is used to examine the inside of the chest, heart, and lungs.  You will need to undress from the waist up and remove jewelry and metal objects before the procedure.  You will be exposed to a small amount of radiation during the procedure.  The X-ray machine will take one or more pictures of your chest while you remain as still as possible.  Later, a health care provider or specialist will review the test results with you. This information is not intended to replace advice given to you by your health care provider. Make sure you discuss any questions you have with your health care provider. Document Revised: 04/23/2018 Document Reviewed: 02/28/2016 Elsevier Patient Education  [place].   Coronary Angiogram With Stent Coronary angiogram with stent placement is a procedure to widen or open a narrow  blood vessel of the heart (coronary artery). Arteries may become blocked by cholesterol buildup (plaques) in the lining of the artery wall. When a coronary artery becomes partially blocked, blood flow to that area decreases. This may lead to chest pain or a heart attack (myocardial infarction). A stent is a small piece of metal that looks like mesh or spring. Stent placement may be done as treatment after a heart attack, or to prevent a heart attack if a blocked artery is found by a coronary angiogram. Let your health care provider know about:  Any allergies you have, including allergies to medicines or contrast dye.  All medicines you are taking, including vitamins, herbs, eye drops, creams, and over-the-counter medicines.  Any problems you or family members have had with anesthetic medicines.  Any blood disorders you have.  Any surgeries you have had.  Any medical conditions you have, including  kidney problems or kidney failure.  Whether you are pregnant or may be pregnant.  Whether you are breastfeeding. What are the risks? Generally, this is a safe procedure. However, serious problems may occur, including:  Damage to nearby structures or organs, such as the heart, blood vessels, or kidneys.  A return of blockage.  Bleeding, infection, or bruising at the insertion site.  A collection of blood under the skin (hematoma) at the insertion site.  A blood clot in another part of the body.  Allergic reaction to medicines or dyes.  Bleeding into the abdomen (retroperitoneal bleeding).  Stroke (rare).  Heart attack (rare). What happens before the procedure? Staying hydrated Follow instructions from your health care provider about hydration, which may include:  Up to 2 hours before the procedure - you may continue to drink clear liquids, such as water, clear fruit juice, black coffee, and plain tea.  Eating and drinking restrictions Follow instructions from your health care provider about eating and drinking, which may include:  8 hours before the procedure - stop eating heavy meals or foods, such as meat, fried foods, or fatty foods.  6 hours before the procedure - stop eating light meals or foods, such as toast or cereal.  2 hours before the procedure - stop drinking clear liquids. Medicines Ask your health care provider about:  Changing or stopping your regular medicines. This is especially important if you are taking diabetes medicines or blood thinners.  Taking medicines such as aspirin and ibuprofen. These medicines can thin your blood. Do not take these medicines unless your health care provider tells you to take them. ? Generally, aspirin is recommended before a thin tube, called a catheter, is passed through a blood vessel and inserted into the heart (cardiac catheterization).  Taking over-the-counter medicines, vitamins, herbs, and supplements. General  instructions  Do not use any products that contain nicotine or tobacco for at least 4 weeks before the procedure. These products include cigarettes, e-cigarettes, and chewing tobacco. If you need help quitting, ask your health care provider.  Plan to have someone take you home from the hospital or clinic.  If you will be going home right after the procedure, plan to have someone with you for 24 hours.  You may have tests and imaging procedures.  Ask your health care provider: ? How your insertion site will be marked. Ask which artery will be used for the procedure. ? What steps will be taken to help prevent infection. These may include:  Removing hair at the insertion site.  Washing skin with a germ-killing soap.  Taking antibiotic  medicine. What happens during the procedure?   An IV will be inserted into one of your veins.  Electrodes may be placed on your chest to monitor your heart rate during the procedure.  You will be given one or more of the following: ? A medicine to help you relax (sedative). ? A medicine to numb the area (local anesthetic) for catheter insertion.  A small incision will be made for catheter insertion.  The catheter will be inserted into an artery using a guide wire. The location may be in your groin, your wrist, or the fold of your arm (near your elbow).  An X-ray procedure (fluoroscopy) will be used to help guide the catheter to the opening of the heart arteries.  A dye will be injected into the catheter. X-rays will be taken. The dye helps to show where any narrowing or blockages are located in the arteries.  Tell your health care provider if you have chest pain or trouble breathing.  A tiny wire will be guided to the blocked spot, and a balloon will be inflated to make the artery wider.  The stent will be expanded to crush the plaques into the wall of the vessel. The stent will hold the area open and improve the blood flow. Most stents have a  drug coating to reduce the risk of the stent narrowing over time.  The artery may be made wider using a drill, laser, or other tools that remove plaques.  The catheter will be removed when the blood flow improves. The stent will stay where it was placed, and the lining of the artery will grow over it.  A bandage (dressing) will be placed on the insertion site. Pressure will be applied to stop bleeding.  The IV will be removed. This procedure may vary among health care providers and hospitals. What happens after the procedure?  Your blood pressure, heart rate, breathing rate, and blood oxygen level will be monitored until you leave the hospital or clinic.  If the procedure is done through the leg, you will lie flat in bed for a few hours or for as long as told by your health care provider. You will be instructed not to bend or cross your legs.  The insertion site and the pulse in your foot or wrist will be checked often.  You may have more blood tests, X-rays, and a test that records the electrical activity of your heart (electrocardiogram, or ECG).  Do not drive for 24 hours if you were given a sedative during your procedure. Summary  Coronary angiogram with stent placement is a procedure to widen or open a narrowed coronary artery. This is done to treat heart problems.  Before the procedure, let your health care provider know about all the medical conditions and surgeries you have or have had.  This is a safe procedure. However, some problems may occur, including damage to nearby structures or organs, bleeding, blood clots, or allergies.  Follow your health care provider's instructions about eating, drinking, medicines, and other lifestyle changes, such as quitting tobacco use before the procedure. This information is not intended to replace advice given to you by your health care provider. Make sure you discuss any questions you have with your health care provider. Document Revised:  07/23/2018 Document Reviewed: 07/23/2018 Elsevier Patient Education  [place].

## 2019-09-16 NOTE — Addendum Note (Signed)
Addended by: Ashok Norris on: 09/16/2019 04:26 PM   Modules accepted: Orders

## 2019-09-16 NOTE — H&P (View-Only) (Signed)
Cardiology Office Note:    Date:  09/16/2019   ID:  Mario Stevenson, DOB 05/24/1945, MRN [id number]  PCP:  Harris, William, MD  Cardiologist:  Terrilyn Tyner, MD    Referring MD: Harris, William, MD   Chief Complaint  Patient presents with  . Follow-up  I am here to follow-up after my test  History of Present Illness:    Mario Stevenson is a 74 y.o. male with past medical history significant for dyslipidemia, essential hypertension who was sent to us because of episode of chest pain.  He is in the process of moving.  He purchased a house in [place].  He developed chest pain every single time he gets upset or excited.  Also when he was carrying some heavy boxes he could develop heavy sensation in the chest.  When I met him last time I offer him cardiac catheterization however he prefer medical therapy, therefore, we pursue coronary CT angio.  Coronary CT angios showed some worrisome finding it looks like he may have significant proximal LAD stenosis.  On top of that he also have some lesion in the distal RCA.  I brought him today to the office to talk about those findings.  He said he slowed down somewhat.  But still when he gets upset or argue with somebody he will get chest pain.  Past Medical History:  Diagnosis Date  . Arthritis   . Chronic kidney disease    kidney stones  . Esophageal stricture   . GERD (gastroesophageal reflux disease)   . Hiatal hernia   . Hyperlipidemia    no per pt  . Hyperplastic colon polyp   . Hypothyroidism     Past Surgical History:  Procedure Laterality Date  . ACHILLES TENDON REPAIR     right  . COLONOSCOPY    . 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 UPPER GASTROINTESTINAL ENDOSCOPY      Current Medications: Current Meds  Medication Sig  . allopurinol (ZYLOPRIM) 100 MG tablet Take 100 mg by mouth daily.  . aspirin EC 81 MG tablet Take 81 mg by mouth daily.  . COLCRYS 0.6 MG tablet   .  levothyroxine (SYNTHROID) 150 MCG tablet Take 150 mcg by mouth daily.  . metoprolol tartrate (LOPRESSOR) 25 MG tablet Take 1 tablet (25 mg total) by mouth 2 (two) times daily.  . nitroGLYCERIN (NITROSTAT) 0.4 MG SL tablet Place 1 tablet (0.4 mg total) under the tongue every 5 (five) minutes as needed for chest pain.  . omeprazole (PRILOSEC) 20 MG capsule Take 1 capsule (20 mg total) by mouth daily.   Current Facility-Administered Medications for the 09/16/19 encounter (Office Visit) with Geneive Sandstrom J, MD  Medication  . 0.9 %  sodium chloride infusion     Allergies:   Penicillins   Social History   Socioeconomic History  . Marital status: Married    Spouse name: Not on file  . Number of children: 3  . Years of education: Not on file  . Highest education level: Not on file  Occupational History  . Occupation: self employed    Employer: [employer]  Tobacco Use  . Smoking status: Never Smoker  . Smokeless tobacco: Never Used  Vaping Use  . Vaping Use: Never used  Substance and Sexual Activity  . Alcohol use: No  . Drug use: No  . Sexual activity: Not on file  Other Topics Concern  . Not   on file  Social History Narrative  . Not on file   Social Determinants of Health   Financial Resource Strain:   . Difficulty of Paying Living Expenses: Not on file  Food Insecurity:   . Worried About Running Out of Food in the Last Year: Not on file  . Ran Out of Food in the Last Year: Not on file  Transportation Needs:   . Lack of Transportation (Medical): Not on file  . Lack of Transportation (Non-Medical): Not on file  Physical Activity:   . Days of Exercise per Week: Not on file  . Minutes of Exercise per Session: Not on file  Stress:   . Feeling of Stress : Not on file  Social Connections:   . Frequency of Communication with Friends and Family: Not on file  . Frequency of Social Gatherings with Friends and Family: Not on file  . Attends Religious Services: Not on  file  . Active Member of Clubs or Organizations: Not on file  . Attends Club or Organization Meetings: Not on file  . Marital Status: Not on file     Family History: The patient's family history includes Diabetes in his mother and sister; Heart attack in his brother and mother; Heart disease in his brother; Heart disease (age of onset: 70) in his mother; Pancreatic cancer in his father. There is no history of Colon cancer, Esophageal cancer, Rectal cancer, or Stomach cancer. ROS:   Please see the history of present illness.    All 14 point review of systems negative except as described per history of present illness  EKGs/Labs/Other Studies Reviewed:      Recent Labs: 07/27/2019: Hemoglobin 14.9; Platelets 205 09/08/2019: BUN 18; Creatinine, Ser 1.13; Potassium 4.6; Sodium 142  Recent Lipid Panel No results found for: CHOL, TRIG, HDL, CHOLHDL, VLDL, LDLCALC, LDLDIRECT  Physical Exam:    VS:  BP 118/80 (BP Location: Right Arm, Patient Position: Sitting, Cuff Size: Normal)   Pulse 66   Ht 5' 10" (1.778 m)   Wt 244 lb (110.7 kg)   SpO2 95%   BMI 35.01 kg/m     Wt Readings from Last 3 Encounters:  09/16/19 244 lb (110.7 kg)  08/17/19 (!) 243 lb (110.2 kg)  07/27/19 235 lb (106.6 kg)     GEN:  Well nourished, well developed in no acute distress HEENT: Normal NECK: No JVD; No carotid bruits LYMPHATICS: No lymphadenopathy CARDIAC: RRR, no murmurs, no rubs, no gallops RESPIRATORY:  Clear to auscultation without rales, wheezing or rhonchi  ABDOMEN: Soft, non-tender, non-distended MUSCULOSKELETAL:  No edema; No deformity  SKIN: Warm and dry LOWER EXTREMITIES: no swelling NEUROLOGIC:  Alert and oriented x 3 PSYCHIATRIC:  Normal affect   ASSESSMENT:    1. Angina pectoris (HCC)   2. Essential hypertension   3. Dyspnea on exertion    PLAN:    In order of problems listed above:  1. Angina pectoris with coronary CT angiogram being abnormal, surprisingly fractional flow  reserve did not show any hemodynamically significant stenosis.  However, with his symptomatology which is fairly typical, abnormal coronary CT angio indicating possibility of proximal LAD I think the best course of action is to pursue cardiac catheterization.  I did explain procedure to him as well as to his wife including all risk benefits as well as alternative.  He is willing to proceed.  He is already have nitroglycerin as well as metoprolol which I will continue, I will also start a statin. 2. Essential   hypertension blood pressure well controlled continue present management. 3. Dyspnea exertion: Could be angina equivalent.   Medication Adjustments/Labs and Tests Ordered: Current medicines are reviewed at length with the patient today.  Concerns regarding medicines are outlined above.  No orders of the defined types were placed in this encounter.  Medication changes: No orders of the defined types were placed in this encounter.   Signed, Madalina Rosman J. Debraann Livingstone, MD, FACC 09/16/2019 4:04 PM    [facility] 

## 2019-09-16 NOTE — Progress Notes (Signed)
Cardiology Office Note:    Date:  09/16/2019   ID:  REO Mario Stevenson, DOB 09-01-1945, MRN [id number]  PCP:  Shirline Frees, MD  Cardiologist:  Jenne Campus, MD    Referring MD: Shirline Frees, MD   Chief Complaint  Patient presents with  . Follow-up  I am here to follow-up after my test  History of Present Illness:    Mario Stevenson is a 74 y.o. male with past medical history significant for dyslipidemia, essential hypertension who was sent to [place] because of episode of chest pain.  He is in the process of moving.  He purchased a house in [place].  He developed chest pain every single time he gets upset or excited.  Also when he was carrying some heavy boxes he could develop heavy sensation in the chest.  When I met him last time I offer him cardiac catheterization however he prefer medical therapy, therefore, we pursue coronary CT angio.  Coronary CT angios showed some worrisome finding it looks like he may have significant proximal LAD stenosis.  On top of that he also have some lesion in the distal RCA.  I brought him today to the office to talk about those findings.  He said he slowed down somewhat.  But still when he gets upset or argue with somebody he will get chest pain.  Past Medical History:  Diagnosis Date  . Arthritis   . Chronic kidney disease    kidney stones  . Esophageal stricture   . GERD (gastroesophageal reflux disease)   . Hiatal hernia   . Hyperlipidemia    no per pt  . Hyperplastic colon polyp   . Hypothyroidism     Past Surgical History:  Procedure Laterality Date  . ACHILLES TENDON REPAIR     right  . COLONOSCOPY    . 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 UPPER GASTROINTESTINAL ENDOSCOPY      Current Medications: Current Meds  Medication Sig  . allopurinol (ZYLOPRIM) 100 MG tablet Take 100 mg by mouth daily.  Marland Kitchen aspirin EC 81 MG tablet Take 81 mg by mouth daily.  Marland Kitchen COLCRYS 0.6 MG tablet   .  levothyroxine (SYNTHROID) 150 MCG tablet Take 150 mcg by mouth daily.  . metoprolol tartrate (LOPRESSOR) 25 MG tablet Take 1 tablet (25 mg total) by mouth 2 (two) times daily.  . nitroGLYCERIN (NITROSTAT) 0.4 MG SL tablet Place 1 tablet (0.4 mg total) under the tongue every 5 (five) minutes as needed for chest pain.  Marland Kitchen omeprazole (PRILOSEC) 20 MG capsule Take 1 capsule (20 mg total) by mouth daily.   Current Facility-Administered Medications for the 09/16/19 encounter (Office Visit) with Park Liter, MD  Medication  . 0.9 %  sodium chloride infusion     Allergies:   Penicillins   Social History   Socioeconomic History  . Marital status: Married    Spouse name: Not on file  . Number of children: 3  . Years of education: Not on file  . Highest education level: Not on file  Occupational History  . Occupation: self employed    Employer: [employer]  Tobacco Use  . Smoking status: Never Smoker  . Smokeless tobacco: Never Used  Vaping Use  . Vaping Use: Never used  Substance and Sexual Activity  . Alcohol use: No  . Drug use: No  . Sexual activity: Not on file  Other Topics Concern  . Not  on file  Social History Narrative  . Not on file   Social Determinants of Health   Financial Resource Strain:   . Difficulty of Paying Living Expenses: Not on file  Food Insecurity:   . Worried About Charity fundraiser in the Last Year: Not on file  . Ran Out of Food in the Last Year: Not on file  Transportation Needs:   . Lack of Transportation (Medical): Not on file  . Lack of Transportation (Non-Medical): Not on file  Physical Activity:   . Days of Exercise per Week: Not on file  . Minutes of Exercise per Session: Not on file  Stress:   . Feeling of Stress : Not on file  Social Connections:   . Frequency of Communication with Friends and Family: Not on file  . Frequency of Social Gatherings with Friends and Family: Not on file  . Attends Religious Services: Not on  file  . Active Member of Clubs or Organizations: Not on file  . Attends Archivist Meetings: Not on file  . Marital Status: Not on file     Family History: The patient's family history includes Diabetes in his mother and sister; Heart attack in his brother and mother; Heart disease in his brother; Heart disease (age of onset: 39) in his mother; Pancreatic cancer in his father. There is no history of Colon cancer, Esophageal cancer, Rectal cancer, or Stomach cancer. ROS:   Please see the history of present illness.    All 14 point review of systems negative except as described per history of present illness  EKGs/Labs/Other Studies Reviewed:      Recent Labs: 07/27/2019: Hemoglobin 14.9; Platelets 205 09/08/2019: BUN 18; Creatinine, Ser 1.13; Potassium 4.6; Sodium 142  Recent Lipid Panel No results found for: CHOL, TRIG, HDL, CHOLHDL, VLDL, LDLCALC, LDLDIRECT  Physical Exam:    VS:  BP 118/80 (BP Location: Right Arm, Patient Position: Sitting, Cuff Size: Normal)   Pulse 66   Ht _0  (1.778 m)   Wt 244 lb (110.7 kg)   SpO2 95%   BMI 35.01 kg/m     Wt Readings from Last 3 Encounters:  09/16/19 244 lb (110.7 kg)  08/17/19 (!) 243 lb (110.2 kg)  07/27/19 235 lb (106.6 kg)     GEN:  Well nourished, well developed in no acute distress HEENT: Normal NECK: No JVD; No carotid bruits LYMPHATICS: No lymphadenopathy CARDIAC: RRR, no murmurs, no rubs, no gallops RESPIRATORY:  Clear to auscultation without rales, wheezing or rhonchi  ABDOMEN: Soft, non-tender, non-distended MUSCULOSKELETAL:  No edema; No deformity  SKIN: Warm and dry LOWER EXTREMITIES: no swelling NEUROLOGIC:  Alert and oriented x 3 PSYCHIATRIC:  Normal affect   ASSESSMENT:    1. Angina pectoris ([place])   2. Essential hypertension   3. Dyspnea on exertion    PLAN:    In order of problems listed above:  1. Angina pectoris with coronary CT angiogram being abnormal, surprisingly fractional flow  reserve did not show any hemodynamically significant stenosis.  However, with his symptomatology which is fairly typical, abnormal coronary CT angio indicating possibility of proximal LAD I think the best course of action is to pursue cardiac catheterization.  I did explain procedure to him as well as to his wife including all risk benefits as well as alternative.  He is willing to proceed.  He is already have nitroglycerin as well as metoprolol which I will continue, I will also start a statin. 2. Essential  hypertension blood pressure well controlled continue present management. 3. Dyspnea exertion: Could be angina equivalent.   Medication Adjustments/Labs and Tests Ordered: Current medicines are reviewed at length with the patient today.  Concerns regarding medicines are outlined above.  No orders of the defined types were placed in this encounter.  Medication changes: No orders of the defined types were placed in this encounter.   Signed, Park Liter, MD, [facility] 09/16/2019 4:04 PM    [place]

## 2019-09-17 ENCOUNTER — Telehealth: Payer: Self-pay | Admitting: *Deleted

## 2019-09-17 LAB — BASIC METABOLIC PANEL
BUN/Creatinine Ratio: 11 (ref 10–24)
BUN: 17 mg/dL (ref 8–27)
CO2: 26 mmol/L (ref 20–29)
Calcium: 9.4 mg/dL (ref 8.6–10.2)
Chloride: 105 mmol/L (ref 96–106)
Creatinine, Ser: 1.61 mg/dL — ABNORMAL HIGH (ref 0.76–1.27)
GFR calc Af Amer: 48 mL/min/{1.73_m2} — ABNORMAL LOW (ref >59)
GFR calc non Af Amer: 42 mL/min/{1.73_m2} — ABNORMAL LOW (ref >59)
Glucose: 94 mg/dL (ref 65–99)
Potassium: 4.5 mmol/L (ref 3.5–5.2)
Sodium: 144 mmol/L (ref 134–144)

## 2019-09-17 LAB — CBC
Hematocrit: 42.8 % (ref 37.5–51.0)
Hemoglobin: 14.5 g/dL (ref 13.0–17.7)
MCH: 31.2 pg (ref 26.6–33.0)
MCHC: 33.9 g/dL (ref 31.5–35.7)
MCV: 92 fL (ref 79–97)
Platelets: 179 10*3/uL (ref 150–450)
RBC: 4.65 x10E6/uL (ref 4.14–5.80)
RDW: 13.6 % (ref 11.6–15.4)
WBC: 5.8 10*3/uL (ref 3.4–10.8)

## 2019-09-17 NOTE — Telephone Encounter (Signed)
Pt contacted pre-catheterization scheduled at [facility] for: Tuesday September 22, 2019 12 Noon Verified arrival time and place: [facility]) at: 7 AM-pre- procedure hydration   No solid food after midnight prior to cath, clear liquids until 5 AM day of procedure.  AM meds can be  taken pre-cath with sips of water including: ASA 81 mg   Confirmed patient has responsible adult to drive home post procedure and be with patient first 24 hours after arriving home: yes  You are allowed ONE visitor in the waiting room during the time you are at the hospital for your procedure. Both you and your visitor must wear a mask once you enter the hospital.       COVID-19 Pre-Screening Questions:  . In the past 10 days have you had a new cough, shortness of breath, headache, congestion, fever (100 or greater) unexplained body aches, new sore throat, or sudden loss of taste or sense of smell? no . In the past 10 days have you been around anyone with known Covid 19? no . Have you been vaccinated for COVID-19? no       Reviewed procedure/mask/visitor instructions, COVID-19 questions with patient.

## 2019-09-19 ENCOUNTER — Other Ambulatory Visit (HOSPITAL_COMMUNITY)
Admission: RE | Admit: 2019-09-19 | Discharge: 2019-09-19 | Disposition: A | Discharge status: Home / Self Care | Payer: PPO | Source: Ambulatory Visit | Attending: Cardiovascular Disease | Admitting: Cardiovascular Disease

## 2019-09-19 DIAGNOSIS — Z01812 Encounter for preprocedural laboratory examination: Secondary | ICD-10-CM | POA: Diagnosis not present

## 2019-09-19 DIAGNOSIS — Z20822 Contact with and (suspected) exposure to covid-19: Secondary | ICD-10-CM | POA: Diagnosis not present

## 2019-09-19 LAB — SARS CORONAVIRUS 2 (TAT 6-24 HRS): SARS Coronavirus 2: NEGATIVE

## 2019-09-22 ENCOUNTER — Other Ambulatory Visit: Payer: Self-pay

## 2019-09-22 ENCOUNTER — Ambulatory Visit (HOSPITAL_COMMUNITY)
Admission: RE | Admit: 2019-09-22 | Discharge: 2019-09-22 | Disposition: A | Discharge status: Home / Self Care | Payer: PPO | Attending: Cardiovascular Disease | Admitting: Cardiovascular Disease

## 2019-09-22 ENCOUNTER — Encounter (HOSPITAL_COMMUNITY)
Admission: RE | Disposition: A | Discharge status: Home / Self Care | Payer: Self-pay | Source: Home / Self Care | Attending: Cardiovascular Disease

## 2019-09-22 ENCOUNTER — Telehealth: Payer: Self-pay | Admitting: Emergency Medicine

## 2019-09-22 DIAGNOSIS — N189 Chronic kidney disease, unspecified: Secondary | ICD-10-CM | POA: Diagnosis not present

## 2019-09-22 DIAGNOSIS — R0609 Other forms of dyspnea: Secondary | ICD-10-CM | POA: Insufficient documentation

## 2019-09-22 DIAGNOSIS — Z7982 Long term (current) use of aspirin: Secondary | ICD-10-CM | POA: Diagnosis not present

## 2019-09-22 DIAGNOSIS — E039 Hypothyroidism, unspecified: Secondary | ICD-10-CM | POA: Insufficient documentation

## 2019-09-22 DIAGNOSIS — Z880 Allergy status to penicillin: Secondary | ICD-10-CM | POA: Diagnosis not present

## 2019-09-22 DIAGNOSIS — Z79899 Other long term (current) drug therapy: Secondary | ICD-10-CM | POA: Insufficient documentation

## 2019-09-22 DIAGNOSIS — I25119 Atherosclerotic heart disease of native coronary artery with unspecified angina pectoris: Secondary | ICD-10-CM | POA: Insufficient documentation

## 2019-09-22 DIAGNOSIS — I10 Essential (primary) hypertension: Secondary | ICD-10-CM | POA: Diagnosis present

## 2019-09-22 DIAGNOSIS — R42 Dizziness and giddiness: Secondary | ICD-10-CM

## 2019-09-22 DIAGNOSIS — I129 Hypertensive chronic kidney disease with stage 1 through stage 4 chronic kidney disease, or unspecified chronic kidney disease: Secondary | ICD-10-CM | POA: Insufficient documentation

## 2019-09-22 DIAGNOSIS — E785 Hyperlipidemia, unspecified: Secondary | ICD-10-CM | POA: Diagnosis not present

## 2019-09-22 DIAGNOSIS — K219 Gastro-esophageal reflux disease without esophagitis: Secondary | ICD-10-CM | POA: Diagnosis not present

## 2019-09-22 DIAGNOSIS — I209 Angina pectoris, unspecified: Secondary | ICD-10-CM | POA: Diagnosis present

## 2019-09-22 HISTORY — PX: LEFT HEART CATH AND CORONARY ANGIOGRAPHY: CATH118249

## 2019-09-22 SURGERY — LEFT HEART CATH AND CORONARY ANGIOGRAPHY
Anesthesia: LOCAL

## 2019-09-22 MED ORDER — MIDAZOLAM HCL 2 MG/2ML IJ SOLN
INTRAMUSCULAR | Status: AC
  Filled 2019-09-22: qty 2

## 2019-09-22 MED ORDER — VERAPAMIL HCL 2.5 MG/ML IV SOLN
INTRAVENOUS | Status: AC
  Filled 2019-09-22: qty 2

## 2019-09-22 MED ORDER — SODIUM CHLORIDE 0.9 % WEIGHT BASED INFUSION
3.0000 mL/kg/h | INTRAVENOUS | Status: AC
  Administered 2019-09-22: 3 mL/kg/h via INTRAVENOUS

## 2019-09-22 MED ORDER — FENTANYL CITRATE (PF) 100 MCG/2ML IJ SOLN
INTRAMUSCULAR | Status: DC | PRN
  Administered 2019-09-22: 25 ug via INTRAVENOUS

## 2019-09-22 MED ORDER — LIDOCAINE HCL (PF) 1 % IJ SOLN
INTRAMUSCULAR | Status: AC
  Filled 2019-09-22: qty 30

## 2019-09-22 MED ORDER — SODIUM CHLORIDE 0.9 % IV SOLN: 250.0000 mL | INTRAVENOUS | Status: DC | PRN

## 2019-09-22 MED ORDER — IOHEXOL 350 MG/ML SOLN
INTRAVENOUS | Status: DC | PRN
  Administered 2019-09-22: 65 mL via INTRA_ARTERIAL

## 2019-09-22 MED ORDER — HYDRALAZINE HCL 20 MG/ML IJ SOLN: 10.0000 mg | INTRAMUSCULAR | Status: DC | PRN

## 2019-09-22 MED ORDER — ASPIRIN 81 MG PO CHEW: 81.0000 mg | CHEWABLE_TABLET | ORAL | Status: DC

## 2019-09-22 MED ORDER — HEPARIN (PORCINE) IN NACL 1000-0.9 UT/500ML-% IV SOLN
INTRAVENOUS | Status: AC
  Filled 2019-09-22: qty 1000

## 2019-09-22 MED ORDER — LIDOCAINE HCL (PF) 1 % IJ SOLN
INTRAMUSCULAR | Status: DC | PRN
  Administered 2019-09-22: 2 mL via SUBCUTANEOUS

## 2019-09-22 MED ORDER — ONDANSETRON HCL 4 MG/2ML IJ SOLN: 4.0000 mg | Freq: Four times a day (QID) | INTRAMUSCULAR | Status: DC | PRN

## 2019-09-22 MED ORDER — SODIUM CHLORIDE 0.9 % WEIGHT BASED INFUSION: 1.0000 mL/kg/h | INTRAVENOUS | Status: DC

## 2019-09-22 MED ORDER — ACETAMINOPHEN 325 MG PO TABS: 650.0000 mg | ORAL_TABLET | ORAL | Status: DC | PRN

## 2019-09-22 MED ORDER — SODIUM CHLORIDE 0.9% FLUSH: 3.0000 mL | INTRAVENOUS | Status: DC | PRN

## 2019-09-22 MED ORDER — SODIUM CHLORIDE 0.9% FLUSH: 3.0000 mL | Freq: Two times a day (BID) | INTRAVENOUS | Status: DC

## 2019-09-22 MED ORDER — HEPARIN (PORCINE) IN NACL 1000-0.9 UT/500ML-% IV SOLN
INTRAVENOUS | Status: DC | PRN
  Administered 2019-09-22 (×2): 500 mL

## 2019-09-22 MED ORDER — HEPARIN SODIUM (PORCINE) 1000 UNIT/ML IJ SOLN
INTRAMUSCULAR | Status: DC | PRN
  Administered 2019-09-22: 5000 [IU] via INTRAVENOUS

## 2019-09-22 MED ORDER — LABETALOL HCL 5 MG/ML IV SOLN: 10.0000 mg | INTRAVENOUS | Status: DC | PRN

## 2019-09-22 MED ORDER — FENTANYL CITRATE (PF) 100 MCG/2ML IJ SOLN
INTRAMUSCULAR | Status: AC
Start: 2019-09-22 — End: ?
  Filled 2019-09-22: qty 2

## 2019-09-22 MED ORDER — VERAPAMIL HCL 2.5 MG/ML IV SOLN
INTRAVENOUS | Status: DC | PRN
  Administered 2019-09-22: 10 mL via INTRA_ARTERIAL

## 2019-09-22 MED ORDER — SODIUM CHLORIDE 0.9 % WEIGHT BASED INFUSION: 1.0000 mL/kg/h | INTRAVENOUS | Status: AC

## 2019-09-22 MED ORDER — HEPARIN SODIUM (PORCINE) 1000 UNIT/ML IJ SOLN
INTRAMUSCULAR | Status: AC
  Filled 2019-09-22: qty 1

## 2019-09-22 MED ORDER — MIDAZOLAM HCL 2 MG/2ML IJ SOLN
INTRAMUSCULAR | Status: DC | PRN
  Administered 2019-09-22: 1 mg via INTRAVENOUS

## 2019-09-22 MED ORDER — NITROGLYCERIN 1 MG/10 ML FOR IR/CATH LAB
INTRA_ARTERIAL | Status: AC
  Filled 2019-09-22: qty 10

## 2019-09-22 SURGICAL SUPPLY — 10 items
CATH 5FR JL3.5 JR4 ANG PIG MP (CATHETERS) ×1 IMPLANT
DEVICE RAD COMP TR BAND LRG (VASCULAR PRODUCTS) ×1 IMPLANT
GLIDESHEATH SLEND SS 6F .021 (SHEATH) ×1 IMPLANT
GUIDEWIRE INQWIRE 1.5J.035X260 (WIRE) IMPLANT
INQWIRE 1.5J .035X260CM (WIRE) ×2
KIT HEART LEFT (KITS) ×2 IMPLANT
PACK CARDIAC CATHETERIZATION (CUSTOM PROCEDURE TRAY) ×2 IMPLANT
SHEATH PROBE COVER 6X72 (BAG) ×1 IMPLANT
TRANSDUCER W/STOPCOCK (MISCELLANEOUS) ×2 IMPLANT
TUBING CIL FLEX 10 FLL-RA (TUBING) ×2 IMPLANT

## 2019-09-22 NOTE — Progress Notes (Addendum)
After 1330 air removal, pt c/o being hot, diaphoretic and nauseous. Pt laid flat, excess blankets removed, cool washcloth given and BP set to cycle q78min. Callie PA paged. BP dipped to systolic of 233 then started to increase. Pt then stated he was beginning to feel better. PA to come see pt prior to D/C.   Update 1415: Pt states he is feeling much better now. No more nausea, diaphoresis or feeling hot.

## 2019-09-22 NOTE — Telephone Encounter (Signed)
Left message for patient to return call regarding new medication and monitor per Dr. Agustin Cree.

## 2019-09-22 NOTE — Discharge Instructions (Signed)
Drink plenty of fluids for 48 hours and keep wrist elevated at heart level for 24 hours  Radial Site Care   This sheet gives you information about how to care for yourself after your procedure. Your health care provider may also give you more specific instructions. If you have problems or questions, contact your health care provider. What can I expect after the procedure? After the procedure, it is common to have:  Bruising and tenderness at the catheter insertion area. Follow these instructions at home: Medicines  Take over-the-counter and prescription medicines only as told by your health care provider. Insertion site care 1. Follow instructions from your health care provider about how to take care of your insertion site. Make sure you: ? Wash your hands with soap and water before you change your bandage (dressing). If soap and water are not available, use hand sanitizer. ? Remove your dressing as told by your health care provider. In 24 hours 2. Check your insertion site every day for signs of infection. Check for: ? Redness, swelling, or pain. ? Fluid or blood. ? Pus or a bad smell. ? Warmth. 3. Do not take baths, swim, or use a hot tub until your health care provider approves. 4. You may shower 24-48 hours after the procedure, or as directed by your health care provider. ? Remove the dressing and gently wash the site with plain soap and water. ? Pat the area dry with a clean towel. ? Do not rub the site. That could cause bleeding. 5. Do not apply powder or lotion to the site. Activity   1. For 24 hours after the procedure, or as directed by your health care provider: ? Do not flex or bend the affected arm. ? Do not push or pull heavy objects with the affected arm. ? Do not drive yourself home from the hospital or clinic. You may drive 24 hours after the procedure unless your health care provider tells you not to. ? Do not operate machinery or power tools. 2. Do not lift  anything that is heavier than 10 lb (4.5 kg), or the limit that you are told, until your health care provider says that it is safe.  For 4 days 3. Ask your health care provider when it is okay to: ? Return to work or school. ? Resume usual physical activities or sports. ? Resume sexual activity. General instructions  If the catheter site starts to bleed, raise your arm and put firm pressure on the site. If the bleeding does not stop, get help right away. This is a medical emergency.  If you went home on the same day as your procedure, a responsible adult should be with you for the first 24 hours after you arrive home.  Keep all follow-up visits as told by your health care provider. This is important. Contact a health care provider if:  You have a fever.  You have redness, swelling, or yellow drainage around your insertion site. Get help right away if:  You have unusual pain at the radial site.  The catheter insertion area swells very fast.  The insertion area is bleeding, and the bleeding does not stop when you hold steady pressure on the area.  Your arm or hand becomes pale, cool, tingly, or numb. These symptoms may represent a serious problem that is an emergency. Do not wait to see if the symptoms will go away. Get medical help right away. Call your local emergency services (911 in the [place] Do   not drive yourself to the hospital. Summary  After the procedure, it is common to have bruising and tenderness at the site.  Follow instructions from your health care provider about how to take care of your radial site wound. Check the wound every day for signs of infection.  Do not lift anything that is heavier than 10 lb (4.5 kg), or the limit that you are told, until your health care provider says that it is safe. This information is not intended to replace advice given to you by your health care provider. Make sure you discuss any questions you have with your health care  provider. Document Revised: 02/06/2017 Document Reviewed: 02/06/2017 Elsevier Patient Education  2020 [employer]vier Inc[employer]  

## 2019-09-22 NOTE — Progress Notes (Signed)
   Received page from RN that patient became diaphoretic and nauseous with removal of air from TR band around 7:58VE. Systolic BP dropped to 746 and then increased. Cardiac cath showed only minimal non-obstructive CAD with 30% stenosis of LAD and RCA. LVEF 55-65%. Normal LVEDP. Patient already getting post-cath hydration. I went to see patient around 2:35pm patient feeling back to normal. No recurrent diaphoresis or nausea. Sounds like vasovagal episode. Will make sure patient can ambulate without any lightheadedness,dizziness, etc. prior to discharge.  Darreld Mclean, PA-C 09/22/2019 2:42 PM

## 2019-09-22 NOTE — Interval H&P Note (Signed)
History and Physical Interval Note:  09/22/2019 11:36 AM  Mario Stevenson  has presented today for surgery, with the diagnosis of cad.  The various methods of treatment have been discussed with the patient and family. After consideration of risks, benefits and other options for treatment, the patient has consented to  Procedure(s): LEFT HEART CATH AND CORONARY ANGIOGRAPHY (N/A) as a surgical intervention.  The patient's history has been reviewed, patient examined, no change in status, stable for surgery.  I have reviewed the patient's chart and labs.  Questions were answered to the patient's satisfaction.   Cath Lab Visit (complete for each Cath Lab visit)  Clinical Evaluation Leading to the Procedure:   ACS: No.  Non-ACS:    Anginal Classification: CCS II  Anti-ischemic medical therapy: Minimal Therapy (1 class of medications)  Non-Invasive Test Results: Intermediate-risk stress test findings: cardiac mortality 1-3%/year  Prior CABG: No previous CABG        Collier Salina [facility] 09/22/2019 11:36 AM

## 2019-09-23 ENCOUNTER — Encounter (HOSPITAL_COMMUNITY): Payer: Self-pay | Admitting: Cardiology

## 2019-09-24 ENCOUNTER — Telehealth: Payer: Self-pay | Admitting: Cardiology

## 2019-09-24 MED ORDER — ATORVASTATIN CALCIUM 10 MG PO TABS: 10.0000 mg | ORAL_TABLET | Freq: Every day | ORAL | 1 refills | Status: DC

## 2019-09-24 NOTE — Telephone Encounter (Signed)
Patient returning Mario Stevenson call.  

## 2019-09-24 NOTE — Telephone Encounter (Signed)
New message:    Patient calling to pick up the monitor and patient would like to know if it is ready. please call patient back

## 2019-09-24 NOTE — Telephone Encounter (Signed)
Tried to call patient back no answer. Will continue efforts.

## 2019-09-24 NOTE — Addendum Note (Signed)
Addended by: Ashok Norris on: 09/24/2019 03:44 PM   Modules accepted: Orders

## 2019-09-25 NOTE — Telephone Encounter (Signed)
Patient informed there was no need for call by scheduler.

## 2019-09-30 ENCOUNTER — Ambulatory Visit (INDEPENDENT_AMBULATORY_CARE_PROVIDER_SITE_OTHER): Payer: PPO

## 2019-09-30 DIAGNOSIS — R42 Dizziness and giddiness: Secondary | ICD-10-CM | POA: Diagnosis not present

## 2019-10-05 ENCOUNTER — Ambulatory Visit: Payer: PPO | Admitting: Cardiology

## 2019-10-15 DIAGNOSIS — R42 Dizziness and giddiness: Secondary | ICD-10-CM | POA: Diagnosis not present

## 2019-11-12 DIAGNOSIS — K635 Polyp of colon: Secondary | ICD-10-CM | POA: Insufficient documentation

## 2019-11-12 DIAGNOSIS — K219 Gastro-esophageal reflux disease without esophagitis: Secondary | ICD-10-CM | POA: Insufficient documentation

## 2019-11-12 DIAGNOSIS — N189 Chronic kidney disease, unspecified: Secondary | ICD-10-CM | POA: Insufficient documentation

## 2019-11-12 DIAGNOSIS — E785 Hyperlipidemia, unspecified: Secondary | ICD-10-CM | POA: Insufficient documentation

## 2019-11-12 DIAGNOSIS — K449 Diaphragmatic hernia without obstruction or gangrene: Secondary | ICD-10-CM | POA: Insufficient documentation

## 2019-11-12 DIAGNOSIS — M1990 Unspecified osteoarthritis, unspecified site: Secondary | ICD-10-CM | POA: Insufficient documentation

## 2019-11-12 DIAGNOSIS — K222 Esophageal obstruction: Secondary | ICD-10-CM | POA: Insufficient documentation

## 2019-11-13 ENCOUNTER — Encounter: Payer: Self-pay | Admitting: Cardiology

## 2019-11-13 ENCOUNTER — Other Ambulatory Visit: Payer: Self-pay

## 2019-11-13 ENCOUNTER — Telehealth: Payer: PPO | Admitting: Cardiology

## 2019-11-13 VITALS — BP 144/76 | HR 72 | Ht 70.0 in | Wt 243.1 lb

## 2019-11-13 DIAGNOSIS — E782 Mixed hyperlipidemia: Secondary | ICD-10-CM

## 2019-11-13 DIAGNOSIS — I209 Angina pectoris, unspecified: Secondary | ICD-10-CM

## 2019-11-13 DIAGNOSIS — I10 Essential (primary) hypertension: Secondary | ICD-10-CM

## 2019-11-13 MED ORDER — METOPROLOL SUCCINATE ER 50 MG PO TB24: 50.0000 mg | ORAL_TABLET | Freq: Every day | ORAL | 3 refills | Status: DC

## 2019-11-13 NOTE — Progress Notes (Signed)
Cardiology Office Note:    Date:  11/13/2019   ID:  Mario Stevenson, DOB Jun 16, 1945, MRN [id number]  PCP:  Shirline Frees, MD  Cardiologist:  Jenne Campus, MD    Referring MD: Shirline Frees, MD   No chief complaint on file. I am doing fine  History of Present Illness:    Mario Stevenson is a 75 y.o. male who was referred to [place] because of fairly typical angina pectoris.  He was in process of moving to his apartment in [facility] and carrying boxes and doing things being very stressed out make him suffer from chest pain.  Coronary CT angio being done after that showed possibility of significant proximal LAD disease.  Cardiac catheterization has been done which showed only 30% of mid RCA as well as 30% of proximal LAD.  He is doing better but still complain of sometimes having chest tightness what happens with exertion.  He is on metoprolol however take this medication appropriately at evening time only 25 mg short acting.  Otherwise he is doing well.  He said he is doing better overall.  Past Medical History:  Diagnosis Date  . Abdominal pain, epigastric 12/06/2009   Qualifier: Diagnosis of  By: Nelson-Smith CMA (AAMA), Dottie    . Angina pectoris ([place]) 08/17/2019  . Arthritis   . Chronic kidney disease    kidney stones  . COLONIC POLYPS, HYPERPLASTIC, HX OF 12/06/2009   Qualifier: Diagnosis of  By: Nelson-Smith CMA (AAMA), Dottie    . Dyslipidemia 12/06/2009   Qualifier: Diagnosis of  By: Nelson-Smith CMA (AAMA), Dottie    . Dyspnea on exertion 08/17/2019  . Esophageal stricture   . ESOPHAGEAL STRICTURE 02/27/2010   Qualifier: Diagnosis of  By: Tamala Julian CMA, Claiborne Billings    . Essential hypertension 08/17/2019  . GERD 12/06/2009   Qualifier: Diagnosis of  By: Nelson-Smith CMA (AAMA), Dottie    . GERD (gastroesophageal reflux disease)   . GYNECOMASTIA, UNILATERAL 12/15/2009   Qualifier: Diagnosis of  By: Olevia Perches MD, Lowella Bandy   . Hiatal hernia   . HIATAL HERNIA 02/27/2010   Qualifier:  Diagnosis of  By: Tamala Julian CMA, Claiborne Billings    . Hyperlipidemia    no per pt  . Hyperplastic colon polyp   . Hypothyroidism   . Osteoarthritis of right knee 06/29/2015  . Status post right knee replacement 06/30/2015    Past Surgical History:  Procedure Laterality Date  . ACHILLES TENDON REPAIR     right  . COLONOSCOPY    . LEFT HEART CATH AND CORONARY ANGIOGRAPHY N/A 09/22/2019   Procedure: LEFT HEART CATH AND CORONARY ANGIOGRAPHY;  Surgeon: Martinique, Peter M, MD;  Location: [place] [facility];  Service: Cardiovascular;  Laterality: N/A;  . 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 UPPER GASTROINTESTINAL ENDOSCOPY      Current Medications: Current Meds  Medication Sig  . aspirin EC 81 MG tablet Take 81 mg by mouth at bedtime.   . Cholecalciferol (VITAMIN D-3) 125 MCG (5000 UT) TABS Take 15,000 Units by mouth daily.  Marland Kitchen COLCRYS 0.6 MG tablet Take 0.6 mg by mouth daily.   Marland Kitchen levothyroxine (SYNTHROID) 150 MCG tablet Take 150 mcg by mouth daily.  Marland Kitchen MAGNESIUM PO Take 50 mg by mouth daily.  . nitroGLYCERIN (NITROSTAT) 0.4 MG SL tablet Place 1 tablet (0.4 mg total) under the tongue every 5 (five) minutes as needed for chest pain.  Marland Kitchen omeprazole (PRILOSEC) 20 MG capsule Take  1 capsule (20 mg total) by mouth daily. (Patient taking differently: Take 20 mg by mouth daily as needed (heartburn). )  . zinc gluconate 50 MG tablet Take 50 mg by mouth daily.  . [DISCONTINUED] metoprolol tartrate (LOPRESSOR) 25 MG tablet Take 1 tablet (25 mg total) by mouth 2 (two) times daily. (Patient taking differently: Take 25 mg by mouth at bedtime. )   Current Facility-Administered Medications for the 11/13/19 encounter (Video Visit) with Park Liter, MD  Medication  . 0.9 %  sodium chloride infusion     Allergies:   Penicillins   Social History   Socioeconomic History  . Marital status: Married    Spouse name: Not on file  . Number of children: 3  . Years of education: Not on  file  . Highest education level: Not on file  Occupational History  . Occupation: self employed    Employer: [employer]  Tobacco Use  . Smoking status: Never Smoker  . Smokeless tobacco: Never Used  Vaping Use  . Vaping Use: Never used  Substance and Sexual Activity  . Alcohol use: No  . Drug use: No  . Sexual activity: Not on file  Other Topics Concern  . Not on file  Social History Narrative  . Not on file   Social Determinants of Health   Financial Resource Strain:   . Difficulty of Paying Living Expenses: Not on file  Food Insecurity:   . Worried About Charity fundraiser in the Last Year: Not on file  . Ran Out of Food in the Last Year: Not on file  Transportation Needs:   . Lack of Transportation (Medical): Not on file  . Lack of Transportation (Non-Medical): Not on file  Physical Activity:   . Days of Exercise per Week: Not on file  . Minutes of Exercise per Session: Not on file  Stress:   . Feeling of Stress : Not on file  Social Connections:   . Frequency of Communication with Friends and Family: Not on file  . Frequency of Social Gatherings with Friends and Family: Not on file  . Attends Religious Services: Not on file  . Active Member of Clubs or Organizations: Not on file  . Attends Archivist Meetings: Not on file  . Marital Status: Not on file     Family History: The patient's family history includes Diabetes in his mother and sister; Heart attack in his brother and mother; Heart disease in his brother; Heart disease (age of onset: 19) in his mother; Pancreatic cancer in his father. There is no history of Colon cancer, Esophageal cancer, Rectal cancer, or Stomach cancer. ROS:   Please see the history of present illness.    All 14 point review of systems negative except as described per history of present illness  EKGs/Labs/Other Studies Reviewed:      Recent Labs: 09/16/2019: BUN 17; Creatinine, Ser 1.61; Hemoglobin 14.5; Platelets  179; Potassium 4.5; Sodium 144  Recent Lipid Panel No results found for: CHOL, TRIG, HDL, CHOLHDL, VLDL, LDLCALC, LDLDIRECT  Physical Exam:    VS:  BP (!) 144/76   Pulse 72   Ht 5\' 10"  (1.778 m)   Wt 243 lb 1.3 oz (110.3 kg)   SpO2 98%   BMI 34.88 kg/m     Wt Readings from Last 3 Encounters:  11/13/19 243 lb 1.3 oz (110.3 kg)  09/22/19 240 lb (108.9 kg)  09/16/19 244 lb (110.7 kg)     GEN:  Well nourished, well developed in no acute distress HEENT: Normal NECK: No JVD; No carotid bruits LYMPHATICS: No lymphadenopathy CARDIAC: RRR, no murmurs, no rubs, no gallops RESPIRATORY:  Clear to auscultation without rales, wheezing or rhonchi  ABDOMEN: Soft, non-tender, non-distended MUSCULOSKELETAL:  No edema; No deformity  SKIN: Warm and dry LOWER EXTREMITIES: no swelling NEUROLOGIC:  Alert and oriented x 3 PSYCHIATRIC:  Normal affect   ASSESSMENT:    1. Angina pectoris ([place])   2. Essential hypertension   3. Mixed hyperlipidemia    PLAN:    In order of problems listed above:  1. Typical angina pectoris in spite of the cardiac catheterization showed only 30% LAD 30% RCA.  I suspect possibility of small vessel disease.  I will change his metoprolol from metoprolol tartrate to metoprolol succinate 50 mg daily in the morning.  We will continue aspirin.  We will continue statin.  I will recheck his fasting lipid profile. 2. Dyslipidemia: He did not start statin I stressed importance of taking Lipitor and explained to him the reason for that and he will do that. 3. Essential hypertension still slightly elevated.  I will increase dose of metoprolol. 4. We did talk about healthy lifestyle and exercises on the regular basis which he is doing.   Medication Adjustments/Labs and Tests Ordered: Current medicines are reviewed at length with the patient today.  Concerns regarding medicines are outlined above.  No orders of the defined types were placed in this encounter.  Medication  changes: No orders of the defined types were placed in this encounter.   Signed, Park Liter, MD, [facility] 11/13/2019 10:26 AM    [place]

## 2019-11-13 NOTE — Patient Instructions (Signed)
Medication Instructions:  Your physician has recommended you make the following change in your medication:  STOP: Lopressor START: Metoprolol Succinate 50 mg take one tablet by mouth daily in the morning.  *If you need a refill on your cardiac medications before your next appointment, please call your pharmacy*   Lab Work: Your physician recommends that you return for lab work in: 6 weeks Lipids If you have labs (blood work) drawn today and your tests are completely normal, you will receive your results only by: Marland Kitchen MyChart Message (if you have MyChart) OR . A paper copy in the mail If you have any lab test that is abnormal or we need to change your treatment, we will call you to review the results.   Testing/Procedures: None   Follow-Up: At [facility], you and your health needs are our priority.  As part of our continuing mission to provide you with exceptional heart care, we have created designated Provider Care Teams.  These Care Teams include your primary Cardiologist (physician) and Advanced Practice Providers (APPs -  Physician Assistants and Nurse Practitioners) who all work together to provide you with the care you need, when you need it.  We recommend signing up for the patient portal called "MyChart".  Sign up information is provided on this After Visit Summary.  MyChart is used to connect with patients for Virtual Visits (Telemedicine).  Patients are able to view lab/test results, encounter notes, upcoming appointments, etc.  Non-urgent messages can be sent to your provider as well.   To learn more about what you can do with MyChart, go to [email or web address].    Your next appointment:   5 month(s)  The format for your next appointment:   In Person  Provider:   Jenne Campus, MD   Other Instructions

## 2019-12-15 DIAGNOSIS — H26493 Other secondary cataract, bilateral: Secondary | ICD-10-CM | POA: Diagnosis not present

## 2019-12-15 DIAGNOSIS — Z961 Presence of intraocular lens: Secondary | ICD-10-CM | POA: Diagnosis not present

## 2019-12-15 DIAGNOSIS — G43B0 Ophthalmoplegic migraine, not intractable: Secondary | ICD-10-CM | POA: Diagnosis not present

## 2020-01-17 DIAGNOSIS — J00 Acute nasopharyngitis [common cold]: Secondary | ICD-10-CM | POA: Diagnosis not present

## 2020-01-17 DIAGNOSIS — R0981 Nasal congestion: Secondary | ICD-10-CM | POA: Diagnosis not present

## 2020-01-17 DIAGNOSIS — B349 Viral infection, unspecified: Secondary | ICD-10-CM | POA: Diagnosis not present

## 2020-01-17 DIAGNOSIS — Z20822 Contact with and (suspected) exposure to covid-19: Secondary | ICD-10-CM | POA: Diagnosis not present

## 2020-01-17 DIAGNOSIS — I519 Heart disease, unspecified: Secondary | ICD-10-CM | POA: Diagnosis not present

## 2020-03-17 DIAGNOSIS — L821 Other seborrheic keratosis: Secondary | ICD-10-CM | POA: Diagnosis not present

## 2020-03-17 DIAGNOSIS — D225 Melanocytic nevi of trunk: Secondary | ICD-10-CM | POA: Diagnosis not present

## 2020-03-17 DIAGNOSIS — D1801 Hemangioma of skin and subcutaneous tissue: Secondary | ICD-10-CM | POA: Diagnosis not present

## 2020-03-17 DIAGNOSIS — L814 Other melanin hyperpigmentation: Secondary | ICD-10-CM | POA: Diagnosis not present

## 2020-03-17 DIAGNOSIS — L570 Actinic keratosis: Secondary | ICD-10-CM | POA: Diagnosis not present

## 2020-03-24 DIAGNOSIS — E039 Hypothyroidism, unspecified: Secondary | ICD-10-CM | POA: Diagnosis not present

## 2020-03-25 ENCOUNTER — Encounter: Payer: Self-pay | Admitting: Gastroenterology

## 2020-04-22 ENCOUNTER — Ambulatory Visit: Payer: PPO | Admitting: Cardiology

## 2020-05-16 ENCOUNTER — Other Ambulatory Visit: Payer: Self-pay | Admitting: Cardiology

## 2020-05-16 NOTE — Telephone Encounter (Signed)
Refill sent to pharmacy.   

## 2020-06-14 ENCOUNTER — Encounter: Payer: Self-pay | Admitting: Cardiology

## 2020-06-14 ENCOUNTER — Other Ambulatory Visit: Payer: Self-pay

## 2020-06-14 ENCOUNTER — Ambulatory Visit: Payer: PPO | Admitting: Cardiology

## 2020-06-14 VITALS — BP 128/78 | HR 63 | Ht 70.0 in | Wt 241.0 lb

## 2020-06-14 DIAGNOSIS — I209 Angina pectoris, unspecified: Secondary | ICD-10-CM | POA: Diagnosis not present

## 2020-06-14 DIAGNOSIS — K219 Gastro-esophageal reflux disease without esophagitis: Secondary | ICD-10-CM

## 2020-06-14 DIAGNOSIS — E785 Hyperlipidemia, unspecified: Secondary | ICD-10-CM

## 2020-06-14 DIAGNOSIS — I10 Essential (primary) hypertension: Secondary | ICD-10-CM

## 2020-06-14 MED ORDER — METOPROLOL SUCCINATE ER 25 MG PO TB24: 25.0000 mg | ORAL_TABLET | Freq: Every day | ORAL | 1 refills | Status: DC

## 2020-06-14 NOTE — Patient Instructions (Signed)
Medication Instructions:  Your physician has recommended you make the following change in your medication:  DECREASE: Metoprolol to 25 mg daily.   *If you need a refill on your cardiac medications before your next appointment, please call your pharmacy*   Lab Work: None If you have labs (blood work) drawn today and your tests are completely normal, you will receive your results only by: Marland Kitchen MyChart Message (if you have MyChart) OR . A paper copy in the mail If you have any lab test that is abnormal or we need to change your treatment, we will call you to review the results.   Testing/Procedures: None   Follow-Up: At [facility], you and your health needs are our priority.  As part of our continuing mission to provide you with exceptional heart care, we have created designated Provider Care Teams.  These Care Teams include your primary Cardiologist (physician) and Advanced Practice Providers (APPs -  Physician Assistants and Nurse Practitioners) who all work together to provide you with the care you need, when you need it.  We recommend signing up for the patient portal called "MyChart".  Sign up information is provided on this After Visit Summary.  MyChart is used to connect with patients for Virtual Visits (Telemedicine).  Patients are able to view lab/test results, encounter notes, upcoming appointments, etc.  Non-urgent messages can be sent to your provider as well.   To learn more about what you can do with MyChart, go to [email or web address].    Your next appointment:   6 month(s)  The format for your next appointment:   In Person  Provider:   Jenne Campus, MD   Other Instructions

## 2020-06-14 NOTE — Progress Notes (Signed)
Cardiology Office Note:    Date:  06/14/2020   ID:  Mario Stevenson, DOB Jan 18, 1945, MRN [id number]  PCP:  Shirline Frees, MD  Cardiologist:  Jenne Campus, MD    Referring MD: Shirline Frees, MD   Chief Complaint  Patient presents with  . Follow-up  Am doing better  History of Present Illness:    Mario Stevenson is a 75 y.o. male who was referred to [place] because of fairly typical angina pectoris.  He was in process of moving to his apartment in [facility] and carrying boxes and doing things being very stressed out make him suffer from chest pain.  Coronary CT angio being done after that showed possibility of significant proximal LAD disease.  Cardiac catheterization has been done which showed only 30% of mid RCA as well as 30% of proximal LAD.   Comes today 2 months of follow-up overall seems to be doing better.  Denies having chest pain tightness squeezing pressure burning chest seems to be tolerating medication quite well.  Past Medical History:  Diagnosis Date  . Abdominal pain, epigastric 12/06/2009   Qualifier: Diagnosis of  By: Nelson-Smith CMA (AAMA), Dottie    . Angina pectoris ([place]) 08/17/2019  . Arthritis   . Chronic kidney disease    kidney stones  . COLONIC POLYPS, HYPERPLASTIC, HX OF 12/06/2009   Qualifier: Diagnosis of  By: Nelson-Smith CMA (AAMA), Dottie    . Dyslipidemia 12/06/2009   Qualifier: Diagnosis of  By: Nelson-Smith CMA (AAMA), Dottie    . Dyspnea on exertion 08/17/2019  . Esophageal stricture   . ESOPHAGEAL STRICTURE 02/27/2010   Qualifier: Diagnosis of  By: Tamala Julian CMA, Claiborne Billings    . Essential hypertension 08/17/2019  . GERD 12/06/2009   Qualifier: Diagnosis of  By: Nelson-Smith CMA (AAMA), Dottie    . GERD (gastroesophageal reflux disease)   . GYNECOMASTIA, UNILATERAL 12/15/2009   Qualifier: Diagnosis of  By: Olevia Perches MD, Lowella Bandy   . Hiatal hernia   . HIATAL HERNIA 02/27/2010   Qualifier: Diagnosis of  By: Tamala Julian CMA, Claiborne Billings    . Hyperlipidemia    no  per pt  . Hyperplastic colon polyp   . Hypothyroidism   . Osteoarthritis of right knee 06/29/2015  . Status post right knee replacement 06/30/2015    Past Surgical History:  Procedure Laterality Date  . ACHILLES TENDON REPAIR     right  . COLONOSCOPY    . LEFT HEART CATH AND CORONARY ANGIOGRAPHY N/A 09/22/2019   Procedure: LEFT HEART CATH AND CORONARY ANGIOGRAPHY;  Surgeon: Martinique, Peter M, MD;  Location: [place] [facility];  Service: Cardiovascular;  Laterality: N/A;  . 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 UPPER GASTROINTESTINAL ENDOSCOPY      Current Medications: Current Meds  Medication Sig  . allopurinol (ZYLOPRIM) 300 MG tablet Take 300 mg by mouth as needed (Gout).  Marland Kitchen aspirin EC 81 MG tablet Take 81 mg by mouth at bedtime.   Marland Kitchen atorvastatin (LIPITOR) 10 MG tablet TAKE 1 TABLET BY MOUTH EVERY DAY (Patient taking differently: Take 10 mg by mouth daily.)  . Cholecalciferol (VITAMIN D-3) 125 MCG (5000 UT) TABS Take 15,000 Units by mouth daily.  Marland Kitchen COLCRYS 0.6 MG tablet Take 0.6 mg by mouth daily.   Marland Kitchen levothyroxine (SYNTHROID) 150 MCG tablet Take 150 mcg by mouth daily.  Marland Kitchen MAGNESIUM PO Take 50 mg by mouth daily.  . nitroGLYCERIN (NITROSTAT) 0.4 MG SL tablet Place 1  tablet (0.4 mg total) under the tongue every 5 (five) minutes as needed for chest pain.  Marland Kitchen omeprazole (PRILOSEC) 20 MG capsule Take 1 capsule (20 mg total) by mouth daily. (Patient taking differently: Take 20 mg by mouth daily as needed (heartburn).)  . zinc gluconate 50 MG tablet Take 50 mg by mouth daily.  . [DISCONTINUED] metoprolol succinate (TOPROL-XL) 50 MG 24 hr tablet Take 1 tablet (50 mg total) by mouth daily. Take with or immediately following a meal.   Current Facility-Administered Medications for the 06/14/20 encounter (Office Visit) with Park Liter, MD  Medication  . 0.9 %  sodium chloride infusion     Allergies:   Penicillins   Social History   Socioeconomic  History  . Marital status: Married    Spouse name: Not on file  . Number of children: 3  . Years of education: Not on file  . Highest education level: Not on file  Occupational History  . Occupation: self employed    Employer: [employer]  Tobacco Use  . Smoking status: Never Smoker  . Smokeless tobacco: Never Used  Vaping Use  . Vaping Use: Never used  Substance and Sexual Activity  . Alcohol use: No  . Drug use: No  . Sexual activity: Not on file  Other Topics Concern  . Not on file  Social History Narrative  . Not on file   Social Determinants of Health   Financial Resource Strain: Not on file  Food Insecurity: Not on file  Transportation Needs: Not on file  Physical Activity: Not on file  Stress: Not on file  Social Connections: Not on file     Family History: The patient's family history includes Diabetes in his mother and sister; Heart attack in his brother and mother; Heart disease in his brother; Heart disease (age of onset: 81) in his mother; Pancreatic cancer in his father. There is no history of Colon cancer, Esophageal cancer, Rectal cancer, or Stomach cancer. ROS:   Please see the history of present illness.    All 14 point review of systems negative except as described per history of present illness  EKGs/Labs/Other Studies Reviewed:      Recent Labs: 09/16/2019: BUN 17; Creatinine, Ser 1.61; Hemoglobin 14.5; Platelets 179; Potassium 4.5; Sodium 144  Recent Lipid Panel No results found for: CHOL, TRIG, HDL, CHOLHDL, VLDL, LDLCALC, LDLDIRECT  Physical Exam:    VS:  BP 128/78 (BP Location: Left Arm, Patient Position: Sitting)   Pulse 63   Ht 5\' 10"  (1.778 m)   Wt 241 lb (109.3 kg)   SpO2 98%   BMI 34.58 kg/m     Wt Readings from Last 3 Encounters:  06/14/20 241 lb (109.3 kg)  11/13/19 243 lb 1.3 oz (110.3 kg)  09/22/19 240 lb (108.9 kg)     GEN:  Well nourished, well developed in no acute distress HEENT: Normal NECK: No JVD; No carotid  bruits LYMPHATICS: No lymphadenopathy CARDIAC: RRR, no murmurs, no rubs, no gallops RESPIRATORY:  Clear to auscultation without rales, wheezing or rhonchi  ABDOMEN: Soft, non-tender, non-distended MUSCULOSKELETAL:  No edema; No deformity  SKIN: Warm and dry LOWER EXTREMITIES: no swelling NEUROLOGIC:  Alert and oriented x 3 PSYCHIATRIC:  Normal affect   ASSESSMENT:    1. Angina pectoris ([place])   2. Essential hypertension   3. Gastroesophageal reflux disease, unspecified whether esophagitis present   4. Dyslipidemia    PLAN:    In order of problems listed above:  1. Angina pectoris murmur episodes overall seems to be doing well with metoprolol but described to have few episodes which she is getting dizzy he thinks metoprolol because it therefore, I asked him to cut down metoprolol from 50 mg once a day to only 25 mg daily. 2. Essential hypertension blood pressure well controlled continue present management. 3. Gastroesophageal reflux disease followed by antimedicine team. 4. Dyslipidemia I wanted to do fasting lipid profile however he is scheduled to follow-up with his primary care physician tomorrow 5.  6. Primary care physician to do it I did review his K PN which show me data from 2019 with LDL of 97 HDL 50 clearly need to be better controlled.   Medication Adjustments/Labs and Tests Ordered: Current medicines are reviewed at length with the patient today.  Concerns regarding medicines are outlined above.  No orders of the defined types were placed in this encounter.  Medication changes:  Meds ordered this encounter  Medications  . metoprolol succinate (TOPROL-XL) 25 MG 24 hr tablet    Sig: Take 1 tablet (25 mg total) by mouth daily. Take with or immediately following a meal.    Dispense:  90 tablet    Refill:  1    Signed, Park Liter, MD, [facility] 06/14/2020 4:38 PM    [facility]

## 2020-06-15 DIAGNOSIS — Z125 Encounter for screening for malignant neoplasm of prostate: Secondary | ICD-10-CM | POA: Diagnosis not present

## 2020-06-15 DIAGNOSIS — K219 Gastro-esophageal reflux disease without esophagitis: Secondary | ICD-10-CM | POA: Diagnosis not present

## 2020-06-15 DIAGNOSIS — N183 Chronic kidney disease, stage 3 unspecified: Secondary | ICD-10-CM | POA: Diagnosis not present

## 2020-06-15 DIAGNOSIS — N401 Enlarged prostate with lower urinary tract symptoms: Secondary | ICD-10-CM | POA: Diagnosis not present

## 2020-06-15 DIAGNOSIS — I25118 Atherosclerotic heart disease of native coronary artery with other forms of angina pectoris: Secondary | ICD-10-CM | POA: Diagnosis not present

## 2020-06-15 DIAGNOSIS — E039 Hypothyroidism, unspecified: Secondary | ICD-10-CM | POA: Diagnosis not present

## 2020-06-15 DIAGNOSIS — M109 Gout, unspecified: Secondary | ICD-10-CM | POA: Diagnosis not present

## 2020-06-15 DIAGNOSIS — Z0000 Encounter for general adult medical examination without abnormal findings: Secondary | ICD-10-CM | POA: Diagnosis not present

## 2020-06-15 DIAGNOSIS — E782 Mixed hyperlipidemia: Secondary | ICD-10-CM | POA: Diagnosis not present

## 2020-11-05 DIAGNOSIS — J22 Unspecified acute lower respiratory infection: Secondary | ICD-10-CM | POA: Diagnosis not present

## 2020-11-11 ENCOUNTER — Other Ambulatory Visit: Payer: Self-pay | Admitting: Cardiology

## 2020-12-19 ENCOUNTER — Encounter: Payer: Self-pay | Admitting: Cardiology

## 2020-12-19 ENCOUNTER — Other Ambulatory Visit: Payer: Self-pay

## 2020-12-19 ENCOUNTER — Ambulatory Visit: Payer: PPO | Admitting: Cardiology

## 2020-12-19 VITALS — BP 130/72 | HR 65 | Ht 70.5 in | Wt 244.0 lb

## 2020-12-19 DIAGNOSIS — I209 Angina pectoris, unspecified: Secondary | ICD-10-CM | POA: Diagnosis not present

## 2020-12-19 DIAGNOSIS — I10 Essential (primary) hypertension: Secondary | ICD-10-CM | POA: Diagnosis not present

## 2020-12-19 DIAGNOSIS — R0609 Other forms of dyspnea: Secondary | ICD-10-CM | POA: Diagnosis not present

## 2020-12-19 DIAGNOSIS — E785 Hyperlipidemia, unspecified: Secondary | ICD-10-CM

## 2020-12-19 NOTE — Patient Instructions (Signed)
Medication Instructions:  Your physician recommends that you continue on your current medications as directed. Please refer to the Current Medication list given to you today.  *If you need a refill on your cardiac medications before your next appointment, please call your pharmacy*   Lab Work: Your physician recommends that you return for lab work today: lipid, lft  If you have labs (blood work) drawn today and your tests are completely normal, you will receive your results only by: MyChart Message (if you have MyChart) OR A paper copy in the mail If you have any lab test that is abnormal or we need to change your treatment, we will call you to review the results.   Testing/Procedures: None   Follow-Up: At [facility], you and your health needs are our priority.  As part of our continuing mission to provide you with exceptional heart care, we have created designated Provider Care Teams.  These Care Teams include your primary Cardiologist (physician) and Advanced Practice Providers (APPs -  Physician Assistants and Nurse Practitioners) who all work together to provide you with the care you need, when you need it.  We recommend signing up for the patient portal called "MyChart".  Sign up information is provided on this After Visit Summary.  MyChart is used to connect with patients for Virtual Visits (Telemedicine).  Patients are able to view lab/test results, encounter notes, upcoming appointments, etc.  Non-urgent messages can be sent to your provider as well.   To learn more about what you can do with MyChart, go to [email or web address].    Your next appointment:   1 year(s)  The format for your next appointment:   In Person  Provider:   Jenne Campus, MD    Other Instructions

## 2020-12-19 NOTE — Addendum Note (Signed)
Addended by: Senaida Ores on: 12/19/2020 11:48 AM   Modules accepted: Orders

## 2020-12-19 NOTE — Progress Notes (Signed)
Cardiology Office Note:    Date:  12/19/2020   ID:  Mario Stevenson, DOB 1945/01/20, MRN [id number]  PCP:  Shirline Frees, MD  Cardiologist:  Jenne Campus, MD    Referring MD: Shirline Frees, MD   Chief Complaint  Patient presents with   Follow-up  I am doing very well  History of Present Illness:    Mario Stevenson is a 75 y.o. male   who was referred to [place] because of fairly typical angina pectoris.  He was in process of moving to his apartment in [facility] and carrying boxes and doing things being very stressed out make him suffer from chest pain.  Coronary CT angio being done after that showed possibility of significant proximal LAD disease.  Cardiac catheterization has been done which showed only 30% of mid RCA as well as 30% of proximal LAD.   He comes today to my office for follow-up.  Overall he is doing very well.  He denies have any chest pain tightness squeezing pressure burning chest.  He goes to walking on the regular basis have no difficulty doing it.  Overall doing well  Past Medical History:  Diagnosis Date   Abdominal pain, epigastric 12/06/2009   Qualifier: Diagnosis of  By: Nelson-Smith CMA (AAMA), Dottie     Angina pectoris ([place]) 08/17/2019   Arthritis    Chronic kidney disease    kidney stones   COLONIC POLYPS, HYPERPLASTIC, HX OF 12/06/2009   Qualifier: Diagnosis of  By: Nelson-Smith CMA (AAMA), Dottie     Dyslipidemia 12/06/2009   Qualifier: Diagnosis of  By: Harlon Ditty CMA (AAMA), Dottie     Dyspnea on exertion 08/17/2019   Esophageal stricture    ESOPHAGEAL STRICTURE 02/27/2010   Qualifier: Diagnosis of  By: Tamala Julian CMA, Claiborne Billings     Essential hypertension 08/17/2019   GERD 12/06/2009   Qualifier: Diagnosis of  By: Harlon Ditty CMA (AAMA), Dottie     GERD (gastroesophageal reflux disease)    GYNECOMASTIA, UNILATERAL 12/15/2009   Qualifier: Diagnosis of  By: Olevia Perches MD, Lowella Bandy    Hiatal hernia    HIATAL HERNIA 02/27/2010   Qualifier: Diagnosis of   By: Tamala Julian CMA, Kelly     Hyperlipidemia    no per pt   Hyperplastic colon polyp    Hypothyroidism    Osteoarthritis of right knee 06/29/2015   Status post right knee replacement 06/30/2015    Past Surgical History:  Procedure Laterality Date   ACHILLES TENDON REPAIR     right   COLONOSCOPY     LEFT HEART CATH AND CORONARY ANGIOGRAPHY N/A 09/22/2019   Procedure: LEFT HEART CATH AND CORONARY ANGIOGRAPHY;  Surgeon: Martinique, Peter M, MD;  Location: [facility];  Service: Cardiovascular;  Laterality: N/A;   REPLACEMENT TOTAL KNEE Right 06/16/2015   [facility] MD did the surgery   UPPER GASTROINTESTINAL ENDOSCOPY      Current Medications: Current Meds  Medication Sig   allopurinol (ZYLOPRIM) 300 MG tablet Take 300 mg by mouth as needed (Gout).   aspirin EC 81 MG tablet Take 81 mg by mouth at bedtime.    atorvastatin (LIPITOR) 10 MG tablet TAKE 1 TABLET BY MOUTH EVERY DAY (Patient taking differently: Take 10 mg by mouth.)   Cholecalciferol (VITAMIN D-3) 125 MCG (5000 UT) TABS Take 15,000 Units by mouth daily.   COLCRYS 0.6 MG tablet Take 0.6 mg by mouth daily.    levothyroxine (SYNTHROID) 175 MCG tablet Take 175 mcg by mouth every morning.  MAGNESIUM PO Take 50 mg by mouth daily.   metoprolol succinate (TOPROL-XL) 25 MG 24 hr tablet Take 1 tablet (25 mg total) by mouth daily. Take with or immediately following a meal.   nitroGLYCERIN (NITROSTAT) 0.4 MG SL tablet Place 1 tablet (0.4 mg total) under the tongue every 5 (five) minutes as needed for chest pain.   omeprazole (PRILOSEC) 20 MG capsule Take 1 capsule (20 mg total) by mouth daily. (Patient taking differently: Take 20 mg by mouth daily as needed (heartburn).)   zinc gluconate 50 MG tablet Take 50 mg by mouth daily.   [DISCONTINUED] levothyroxine (SYNTHROID) 150 MCG tablet Take 150 mcg by mouth daily.   Current Facility-Administered Medications for the 12/19/20 encounter (Office Visit) with Park Liter, MD  Medication   0.9  %  sodium chloride infusion     Allergies:   Penicillins   Social History   Socioeconomic History   Marital status: Married    Spouse name: Not on file   Number of children: 3   Years of education: Not on file   Highest education level: Not on file  Occupational History   Occupation: self employed    Employer: [employer]  Tobacco Use   Smoking status: Never   Smokeless tobacco: Never  Vaping Use   Vaping Use: Never used  Substance and Sexual Activity   Alcohol use: No   Drug use: No   Sexual activity: Not on file  Other Topics Concern   Not on file  Social History Narrative   Not on file   Social Determinants of Health   Financial Resource Strain: Not on file  Food Insecurity: Not on file  Transportation Needs: Not on file  Physical Activity: Not on file  Stress: Not on file  Social Connections: Not on file     Family History: The patient's family history includes Diabetes in his mother and sister; Heart attack in his brother and mother; Heart disease in his brother; Heart disease (age of onset: 74) in his mother; Pancreatic cancer in his father. There is no history of Colon cancer, Esophageal cancer, Rectal cancer, or Stomach cancer. ROS:   Please see the history of present illness.    All 14 point review of systems negative except as described per history of present illness  EKGs/Labs/Other Studies Reviewed:      Recent Labs: No results found for requested labs within last 8760 hours.  Recent Lipid Panel No results found for: CHOL, TRIG, HDL, CHOLHDL, VLDL, LDLCALC, LDLDIRECT  Physical Exam:    VS:  BP 130/72 (BP Location: Right Arm, Patient Position: Sitting)   Pulse 65   Ht 5' 10.5" (1.791 m)   Wt 244 lb (110.7 kg)   SpO2 94%   BMI 34.52 kg/m     Wt Readings from Last 3 Encounters:  12/19/20 244 lb (110.7 kg)  06/14/20 241 lb (109.3 kg)  11/13/19 243 lb 1.3 oz (110.3 kg)     GEN:  Well nourished, well developed in no acute  distress HEENT: Normal NECK: No JVD; No carotid bruits LYMPHATICS: No lymphadenopathy CARDIAC: RRR, no murmurs, no rubs, no gallops RESPIRATORY:  Clear to auscultation without rales, wheezing or rhonchi  ABDOMEN: Soft, non-tender, non-distended MUSCULOSKELETAL:  No edema; No deformity  SKIN: Warm and dry LOWER EXTREMITIES: no swelling NEUROLOGIC:  Alert and oriented x 3 PSYCHIATRIC:  Normal affect   ASSESSMENT:    1. Angina pectoris ([place])   2. Essential hypertension   3.  Dyslipidemia   4. Dyspnea on exertion    PLAN:    In order of problems listed above:  Angina pectoris denies having any.  Cardiac catheterization reviewed no critical lesions continue risk factors modifications..   Essential hypertension: Blood pressure well controlled continue present medications. Dyslipidemia he is taking Lipitor 10 which I will continue I did review K PN which show me LDL 96 HDL 59 this is from summer of this year we will recheck fasting lipid profile today. Elijio Miles exertion denies having any   Medication Adjustments/Labs and Tests Ordered: Current medicines are reviewed at length with the patient today.  Concerns regarding medicines are outlined above.  No orders of the defined types were placed in this encounter.  Medication changes: No orders of the defined types were placed in this encounter.   Signed, Park Liter, MD, [facility] 12/19/2020 11:44 AM    [place]

## 2020-12-20 LAB — LIPID PANEL
Chol/HDL Ratio: 3.2 ratio (ref 0.0–5.0)
Cholesterol, Total: 133 mg/dL (ref 100–199)
HDL: 42 mg/dL (ref >39)
LDL Chol Calc (NIH): 70 mg/dL (ref 0–99)
Triglycerides: 114 mg/dL (ref 0–149)
VLDL Cholesterol Cal: 21 mg/dL (ref 5–40)

## 2020-12-20 LAB — HEPATIC FUNCTION PANEL
ALT: 54 IU/L — ABNORMAL HIGH (ref 0–44)
AST: 55 IU/L — ABNORMAL HIGH (ref 0–40)
Albumin: 4.3 g/dL (ref 3.7–4.7)
Alkaline Phosphatase: 287 IU/L — ABNORMAL HIGH (ref 44–121)
Bilirubin Total: 0.8 mg/dL (ref 0.0–1.2)
Bilirubin, Direct: 0.32 mg/dL (ref 0.00–0.40)
Total Protein: 6.7 g/dL (ref 6.0–8.5)

## 2020-12-30 DIAGNOSIS — Z23 Encounter for immunization: Secondary | ICD-10-CM | POA: Diagnosis not present

## 2020-12-30 DIAGNOSIS — E782 Mixed hyperlipidemia: Secondary | ICD-10-CM | POA: Diagnosis not present

## 2020-12-30 DIAGNOSIS — R748 Abnormal levels of other serum enzymes: Secondary | ICD-10-CM | POA: Diagnosis not present

## 2020-12-30 DIAGNOSIS — E039 Hypothyroidism, unspecified: Secondary | ICD-10-CM | POA: Diagnosis not present

## 2021-01-02 ENCOUNTER — Other Ambulatory Visit: Payer: Self-pay | Admitting: Family Medicine

## 2021-01-02 DIAGNOSIS — R748 Abnormal levels of other serum enzymes: Secondary | ICD-10-CM

## 2021-03-02 ENCOUNTER — Other Ambulatory Visit: Payer: Self-pay | Admitting: Cardiology

## 2021-03-02 NOTE — Telephone Encounter (Signed)
Metoprolol Succinate x 3 refills sent to   [id number] [place], [place]

## 2021-09-07 ENCOUNTER — Telehealth: Payer: Self-pay | Admitting: Cardiology

## 2021-09-07 NOTE — Telephone Encounter (Signed)
Pt confirmed the date as 09/2019.

## 2021-09-07 NOTE — Telephone Encounter (Signed)
Pt is calling requesting a letter stating that he had to have a left heart cath and coronary angiography done on 09/22/2019 and was unable to travel at that time. He states that he was [place] and had his trip covered by insurance incase something happened, but they will not refund this amount, unless he has this letter from his doctor. Requesting call back to discuss how to go about getting this. Please advise.

## 2021-11-15 ENCOUNTER — Ambulatory Visit: Payer: PPO | Admitting: Cardiology

## 2021-11-16 ENCOUNTER — Encounter: Payer: Self-pay | Admitting: Cardiology

## 2021-11-16 ENCOUNTER — Ambulatory Visit: Payer: Medicare Other | Attending: Cardiology | Admitting: Cardiology

## 2021-11-16 VITALS — BP 136/74 | HR 74 | Ht 70.0 in | Wt 245.8 lb

## 2021-11-16 DIAGNOSIS — R0609 Other forms of dyspnea: Secondary | ICD-10-CM | POA: Diagnosis not present

## 2021-11-16 DIAGNOSIS — E782 Mixed hyperlipidemia: Secondary | ICD-10-CM

## 2021-11-16 DIAGNOSIS — I209 Angina pectoris, unspecified: Secondary | ICD-10-CM | POA: Diagnosis not present

## 2021-11-16 DIAGNOSIS — I10 Essential (primary) hypertension: Secondary | ICD-10-CM

## 2021-11-16 NOTE — Patient Instructions (Signed)

## 2021-11-16 NOTE — Progress Notes (Signed)
Cardiology Office Note:    Date:  11/16/2021   ID:  Mario Stevenson, DOB Jan 13, 1946, MRN [id number]  PCP:  Shirline Frees, MD  Cardiologist:  Jenne Campus, MD    Referring MD: Shirline Frees, MD   Chief Complaint  Patient presents with   annual follow up  Doing fine  History of Present Illness:    Mario Stevenson is a 76 y.o. male with past medical history significant for fairly typical angina pectoris that developed when he was moving to [facility] carrying boxes.  After that coronary CT angio has been performed which showed possibility of significant proximal LAD disease, however, cardiac catheterization done thereafter showed only 30% mid RCA 30% proximal LAD the key is risk factors modifications.  He is doing very well comes for regular follow-up denies have any chest pain tightness squeezing pressure burning chest with exercise but does develop some chest pain sometimes when he gets upset.  He is trying to be active and he is actually upset with himself that he did not lose more weight.  Past Medical History:  Diagnosis Date   Abdominal pain, epigastric 12/06/2009   Qualifier: Diagnosis of  By: Nelson-Smith CMA (AAMA), Dottie     Angina pectoris ([place]) 08/17/2019   Arthritis    Chronic kidney disease    kidney stones   COLONIC POLYPS, HYPERPLASTIC, HX OF 12/06/2009   Qualifier: Diagnosis of  By: Nelson-Smith CMA (AAMA), Dottie     Dyslipidemia 12/06/2009   Qualifier: Diagnosis of  By: Harlon Ditty CMA (AAMA), Dottie     Dyspnea on exertion 08/17/2019   Esophageal stricture    ESOPHAGEAL STRICTURE 02/27/2010   Qualifier: Diagnosis of  By: Tamala Julian CMA, Claiborne Billings     Essential hypertension 08/17/2019   GERD 12/06/2009   Qualifier: Diagnosis of  By: Harlon Ditty CMA (AAMA), Dottie     GERD (gastroesophageal reflux disease)    GYNECOMASTIA, UNILATERAL 12/15/2009   Qualifier: Diagnosis of  By: Olevia Perches MD, Lowella Bandy    Hiatal hernia    HIATAL HERNIA 02/27/2010   Qualifier:  Diagnosis of  By: Tamala Julian CMA, Kelly     Hyperlipidemia    no per pt   Hyperplastic colon polyp    Hypothyroidism    Osteoarthritis of right knee 06/29/2015   Status post right knee replacement 06/30/2015    Past Surgical History:  Procedure Laterality Date   ACHILLES TENDON REPAIR     right   COLONOSCOPY     LEFT HEART CATH AND CORONARY ANGIOGRAPHY N/A 09/22/2019   Procedure: LEFT HEART CATH AND CORONARY ANGIOGRAPHY;  Surgeon: Martinique, Peter M, MD;  Location: [facility];  Service: Cardiovascular;  Laterality: N/A;   REPLACEMENT TOTAL KNEE Right 06/16/2015   [facility] MD did the surgery   UPPER GASTROINTESTINAL ENDOSCOPY      Current Medications: Current Meds  Medication Sig   allopurinol (ZYLOPRIM) 300 MG tablet Take 300 mg by mouth as needed (Gout).   aspirin EC 81 MG tablet Take 81 mg by mouth at bedtime.    atorvastatin (LIPITOR) 10 MG tablet TAKE 1 TABLET BY MOUTH EVERY DAY (Patient taking differently: Take 10 mg by mouth daily.)   Cholecalciferol (VITAMIN D-3) 125 MCG (5000 UT) TABS Take 15,000 Units by mouth daily.   COLCRYS 0.6 MG tablet Take 0.6 mg by mouth daily.    levothyroxine (SYNTHROID) 175 MCG tablet Take 175 mcg by mouth every morning.   MAGNESIUM PO Take 50 mg by mouth daily.   metoprolol  succinate (TOPROL-XL) 25 MG 24 hr tablet TAKE 1 TABLET BY MOUTH DAILY. TAKE WITH OR IMMEDIATELY FOLLOWING A MEAL. (Patient taking differently: Take 25 mg by mouth daily.)   nitroGLYCERIN (NITROSTAT) 0.4 MG SL tablet Place 1 tablet (0.4 mg total) under the tongue every 5 (five) minutes as needed for chest pain.   omeprazole (PRILOSEC) 20 MG capsule Take 1 capsule (20 mg total) by mouth daily. (Patient taking differently: Take 20 mg by mouth daily as needed (heartburn).)   zinc gluconate 50 MG tablet Take 50 mg by mouth daily.   Current Facility-Administered Medications for the 11/16/21 encounter (Office Visit) with Park Liter, MD  Medication   0.9 %  sodium chloride  infusion     Allergies:   Penicillins   Social History   Socioeconomic History   Marital status: Married    Spouse name: Not on file   Number of children: 3   Years of education: Not on file   Highest education level: Not on file  Occupational History   Occupation: self employed    Employer: [employer]  Tobacco Use   Smoking status: Never   Smokeless tobacco: Never  Vaping Use   Vaping Use: Never used  Substance and Sexual Activity   Alcohol use: No   Drug use: No   Sexual activity: Not on file  Other Topics Concern   Not on file  Social History Narrative   Not on file   Social Determinants of Health   Financial Resource Strain: Not on file  Food Insecurity: Not on file  Transportation Needs: Not on file  Physical Activity: Not on file  Stress: Not on file  Social Connections: Not on file     Family History: The patient's family history includes Diabetes in his mother and sister; Heart attack in his brother and mother; Heart disease in his brother; Heart disease (age of onset: 92) in his mother; Pancreatic cancer in his father. There is no history of Colon cancer, Esophageal cancer, Rectal cancer, or Stomach cancer. ROS:   Please see the history of present illness.    All 14 point review of systems negative except as described per history of present illness  EKGs/Labs/Other Studies Reviewed:      Recent Labs: 12/19/2020: ALT 54  Recent Lipid Panel    Component Value Date/Time   CHOL 133 12/19/2020 1152   TRIG 114 12/19/2020 1152   HDL 42 12/19/2020 1152   CHOLHDL 3.2 12/19/2020 1152   LDLCALC 70 12/19/2020 1152    Physical Exam:    VS:  BP 136/74 (BP Location: Left Arm, Patient Position: Sitting)   Pulse 74   Ht '5\' 10"'$  (1.778 m)   Wt 245 lb 12.8 oz (111.5 kg)   SpO2 97%   BMI 35.27 kg/m     Wt Readings from Last 3 Encounters:  11/16/21 245 lb 12.8 oz (111.5 kg)  12/19/20 244 lb (110.7 kg)  06/14/20 241 lb (109.3 kg)     GEN:  Well  nourished, well developed in no acute distress HEENT: Normal NECK: No JVD; No carotid bruits LYMPHATICS: No lymphadenopathy CARDIAC: RRR, no murmurs, no rubs, no gallops RESPIRATORY:  Clear to auscultation without rales, wheezing or rhonchi  ABDOMEN: Soft, non-tender, non-distended MUSCULOSKELETAL:  No edema; No deformity  SKIN: Warm and dry LOWER EXTREMITIES: no swelling NEUROLOGIC:  Alert and oriented x 3 PSYCHIATRIC:  Normal affect   ASSESSMENT:    1. Angina pectoris ([place])   2. Essential hypertension  3. Mixed hyperlipidemia   4. Dyspnea on exertion    PLAN:    In order of problems listed above:  Angina pectoris does not look typical coronary angiography revealed only 30% LAD and RCA disease risk factors modifications, he is on antiplatelet therapy as well as statin which I will continue Essential hypertension blood pressure well controlled continue present management Dyslipidemia I did review K PN which only his LDL of 67 HDL 51 this is from summer this year moderate intensity statin which I will continue. Dyspnea on exertion denies having much of this he understand he need to exercise more and he will try to do that   Medication Adjustments/Labs and Tests Ordered: Current medicines are reviewed at length with the patient today.  Concerns regarding medicines are outlined above.  No orders of the defined types were placed in this encounter.  Medication changes: No orders of the defined types were placed in this encounter.   Signed, Park Liter, MD, [facility] 11/16/2021 3:12 PM    [place]

## 2022-04-17 ENCOUNTER — Other Ambulatory Visit: Payer: Self-pay

## 2022-04-17 ENCOUNTER — Telehealth: Payer: Self-pay | Admitting: Cardiology

## 2022-04-17 MED ORDER — ATORVASTATIN CALCIUM 10 MG PO TABS: 10.0000 mg | ORAL_TABLET | Freq: Every day | ORAL | 3 refills | Status: DC

## 2022-04-17 MED ORDER — METOPROLOL SUCCINATE ER 25 MG PO TB24: 25.0000 mg | ORAL_TABLET | Freq: Every day | ORAL | 3 refills | Status: DC

## 2022-04-17 NOTE — Telephone Encounter (Signed)
*  STAT* If patient is at the pharmacy, call can be transferred to refill team.   1. Which medications need to be refilled? (please list name of each medication and dose if known)   metoprolol succinate (TOPROL-XL) 25 MG 24 hr tablet  atorvastatin (LIPITOR) 10 MG tablet   2. Which pharmacy/location (including street and city if local pharmacy) is medication to be sent to?  [id number] - [place], [place] - [address]   3. Do they need a 30 day or 90 day supply?   90 day   Patient stated he has almost out of these medications.

## 2022-07-04 LAB — LAB REPORT - SCANNED
A1c: 5.4
EGFR: 63

## 2023-04-28 ENCOUNTER — Other Ambulatory Visit: Payer: Self-pay | Admitting: Cardiology

## 2023-04-29 ENCOUNTER — Other Ambulatory Visit: Payer: Self-pay | Admitting: Cardiology

## 2023-05-09 ENCOUNTER — Other Ambulatory Visit: Payer: Self-pay | Admitting: Cardiology

## 2023-05-16 ENCOUNTER — Other Ambulatory Visit: Payer: Self-pay

## 2023-05-16 NOTE — Progress Notes (Signed)
 Cardiology Office Note:  .   Date:  05/17/2023  ID:  Mario Stevenson, DOB 03-15-1945, MRN [id number] PCP: Roselind Congo, MD  [facility] Providers Cardiologist:  None    History of Present Illness: .   Mario Stevenson is a 78 y.o. male with a past medical history of hypertension, nonobstructive CAD, GERD, hypothyroidism, CKD, dyslipidemia  09/30/2019 monitor average heart rate 69 bpm, rare PACs, largely normal 09/22/2019 cardiac cath mild nonobstructive CAD 10/28/2019 coronary CTA calcium  score 1104, 82nd percentile, FFR was negative for hemodynamic significance 03/19/2016 echo severe focal basal hypertrophy of the septum with mild posterior wall hypertrophy, grade 2 DD  He established with [facility] in 2018 at the behest of his PCP for evaluation of shortness of breath, we did not see him for several years.  He reestablished with Dr. Gordan Latina in 2021 for episodes of chest pain, he had been evaluated in the emergency department with no contributory findings, subsequently underwent a coronary CTA revealing a calcium  score of 11 0454 was negative for hemodynamic significance however, he continued to have episodes of chest pain so you ultimately underwent a left heart cath on 09/22/2019 revealing mild nonobstructive CAD.  Most recently was evaluated by Dr. Gordan Latina on 11/16/2021, he was trying to be more physically active and focusing on weight loss, no changes were made to his plan of care and he was advised to follow-up in a year.  He presents today for follow-up of his hypertension and mild nonobstructive CAD.  He is currently residing full-time in [facility], had been coming back to see his PCP in our office however he has plans to establish care with a cardiologist at [facility].  He stays busy, he does lawn work, as well as exercises on a regular basis.  He does have some episodes of atypical chest pain, this is initially why he established with our office.  He states it was  described to him that it could be small vessel disease, but it sounds to be possibly more musculoskeletal in nature as it does not typically occur when he is exerting himself. He denies chest pain, palpitations, dyspnea, pnd, orthopnea, n, v, dizziness, syncope, edema, weight gain, or early satiety.    ROS: ROS   Studies Reviewed: .        Cardiac Studies & Procedures   ______________________________________________________________________________________________ CARDIAC CATHETERIZATION  CARDIAC CATHETERIZATION 09/22/2019  Conclusion  Prox LAD to Mid LAD lesion is 30% stenosed.  Mid RCA lesion is 30% stenosed.  The left ventricular systolic function is normal.  LV end diastolic pressure is normal.  The left ventricular ejection fraction is 55-65% by visual estimate.  1. Nonobstructive CAD 2. Normal LV function 3. Normal LVEDP  Plan: risk factor modification. Medical management.  Findings Coronary Findings Diagnostic  Dominance: Right  Left Main Vessel was injected. Vessel is normal in caliber. Vessel is angiographically normal. The vessel is moderately ectatic.  Left Anterior Descending The vessel is moderately calcified. Ectasia at the ostium Prox LAD to Mid LAD lesion is 30% stenosed.  Ramus Intermedius Vessel was injected. Vessel is small. Vessel is angiographically normal.  Left Circumflex Vessel was injected. Vessel is normal in caliber. Vessel is angiographically normal. Ectasia at the ostium  Right Coronary Artery The vessel is moderately calcified. Mid RCA lesion is 30% stenosed.  Intervention  No interventions have been documented.   STRESS TESTS  MYOCARDIAL PERFUSION IMAGING 03/20/2016  Narrative  The left ventricular ejection fraction is hyperdynamic (>65%).  Nuclear stress EF: 66%.  Blood pressure demonstrated a hypertensive response to exercise.  No T wave inversion was noted during stress.  There was no ST segment deviation noted  during stress.  This is a low risk study.  Normal perfusion. LVEF 66% with normal wall motion. This is a low risk study.   ECHOCARDIOGRAM  ECHOCARDIOGRAM COMPLETE 03/19/2016  Narrative *[place]* *[facility]* [address] [place], [place] [address] [phone]  ------------------------------------------------------------------- Transthoracic Echocardiography  Patient:    Mario Stevenson, Mario Stevenson MR #:       [id number] Study Date: 03/19/2016 Gender:     M Age:        66 Height:     180.3 cm Weight:     120.7 kg BSA:        2.5 m^2 Pt. Status: Room:  ATTENDING    Pearlie Bougie REFERRING    Onetha Bile PERFORMING   [facility], Outpatient SONOGRAPHER  Konnie Perry, RDCS  cc:  -------------------------------------------------------------------  ------------------------------------------------------------------- Indications:      Shortness of breath 786.05.  ------------------------------------------------------------------- History:   PMH:  Knee replacement nine months ago. Post surgery he now experiences fatigue, shortness of breath, and occasional lower extremity edema.  ------------------------------------------------------------------- Study Conclusions  - Left ventricle: The cavity size was normal. There was severe focal basal hypertrophy of the septum with mild posterior wall hypertrophy. Systolic function was normal. Wall motion was normal; there were no regional wall motion abnormalities. Features are consistent with a pseudonormal left ventricular filling pattern, with concomitant abnormal relaxation and increased filling pressure (grade 2 diastolic dysfunction). Doppler parameters are consistent with indeterminate ventricular filling pressure. - Aortic valve: Transvalvular velocity was within the normal range. There was no stenosis. There was no regurgitation. - Mitral valve: Transvalvular velocity was  within the normal range. There was no evidence for stenosis. There was trivial regurgitation. - Right ventricle: The cavity size was normal. Wall thickness was normal. Systolic function was normal. - Tricuspid valve: There was mild regurgitation. - Pulmonary arteries: Systolic pressure was within the normal range. PA peak pressure: 23 mm Hg (S).  ------------------------------------------------------------------- Study data:  No prior study was available for comparison.  Study status:  Routine.  Procedure:  The patient reported no pain pre or post test. Transthoracic echocardiography. Image quality was adequate.  Study completion:  There were no complications. Transthoracic echocardiography.  M-mode, complete 2D, spectral Doppler, and color Doppler.  Birthdate:  Patient birthdate: [birth year].  Age:  Patient is 78 yr old.  Sex:  Gender: male. BMI: 37.1 kg/m^2.  Blood pressure:     129/80  Patient status: Inpatient.  Study date:  Study date: 03/19/2016. Study time: 02:00 PM.  Location:  Echo laboratory.  -------------------------------------------------------------------  ------------------------------------------------------------------- Left ventricle:  The cavity size was normal. There was severe focal basal hypertrophy of the septum with mild posterior wall hypertrophy. Systolic function was normal. Wall motion was normal; there were no regional wall motion abnormalities. Features are consistent with a pseudonormal left ventricular filling pattern, with concomitant abnormal relaxation and increased filling pressure (grade 2 diastolic dysfunction). Doppler parameters are consistent with indeterminate ventricular filling pressure.  ------------------------------------------------------------------- Aortic valve:   Trileaflet; normal thickness leaflets. Mobility was not restricted.  Doppler:  Transvalvular velocity was within the normal range. There was no stenosis. There was no  regurgitation.  ------------------------------------------------------------------- Aorta:  Aortic root: The aortic root was normal in size.  ------------------------------------------------------------------- Mitral valve:   Structurally  normal valve.   Mobility was not restricted.  Doppler:  Transvalvular velocity was within the normal range. There was no evidence for stenosis. There was trivial regurgitation.    Peak gradient (D): 2 mm Hg.  ------------------------------------------------------------------- Left atrium:  The atrium was normal in size.  ------------------------------------------------------------------- Right ventricle:  The cavity size was normal. Wall thickness was normal. Systolic function was normal.  ------------------------------------------------------------------- Pulmonic valve:    Structurally normal valve.   Cusp separation was normal.  Doppler:  Transvalvular velocity was within the normal range. There was no evidence for stenosis. There was no regurgitation.  ------------------------------------------------------------------- Tricuspid valve:   Structurally normal valve.    Doppler: Transvalvular velocity was within the normal range. There was mild regurgitation.  ------------------------------------------------------------------- Pulmonary artery:   The main pulmonary artery was normal-sized. Systolic pressure was within the normal range.  ------------------------------------------------------------------- Right atrium:  The atrium was normal in size.  ------------------------------------------------------------------- Pericardium:  There was no pericardial effusion.  ------------------------------------------------------------------- Systemic veins: Inferior vena cava: The vessel was normal in size. The respirophasic diameter changes were in the normal range (>= 50%), consistent with normal central venous  pressure.  ------------------------------------------------------------------- Measurements  Left ventricle                         Value        Reference LV ID, ED, PLAX chordal        (L)     38.7  mm     43 - 52 LV ID, ES, PLAX chordal                25.1  mm     23 - 38 LV fx shortening, PLAX chordal         35    %      >=29 LV PW thickness, ED                    11.9  mm     ---------- IVS/LV PW ratio, ED            (H)     1.76         <=1.3 Stroke volume, 2D                      54    ml     ---------- Stroke volume/bsa, 2D                  22    ml/m^2 ---------- LV e&', lateral                         4.13  cm/s   ---------- LV E/e&', lateral                       17.38        ---------- LV e&', medial                          5.66  cm/s   ---------- LV E/e&', medial                        12.69        ---------- LV e&', average  4.9   cm/s   ---------- LV E/e&', average                       14.67        ----------  Ventricular septum                     Value        Reference IVS thickness, ED                      20.9  mm     ----------  LVOT                                   Value        Reference LVOT ID, S                             20    mm     ---------- LVOT area                              3.14  cm^2   ---------- LVOT peak velocity, S                  73.2  cm/s   ---------- LVOT mean velocity, S                  57.8  cm/s   ---------- LVOT VTI, S                            17.3  cm     ---------- LVOT peak gradient, S                  2     mm Hg  ----------  Aorta                                  Value        Reference Aortic root ID, ED                     37    mm     ----------  Left atrium                            Value        Reference LA ID, A-P, ES                         50    mm     ---------- LA ID/bsa, A-P                         2     cm/m^2 <=2.2 LA volume, S                           55.5  ml     ---------- LA  volume/bsa, S  22.2  ml/m^2 ---------- LA volume, ES, 1-p A4C                 63.6  ml     ---------- LA volume/bsa, ES, 1-p A4C             25.4  ml/m^2 ---------- LA volume, ES, 1-p A2C                 45.9  ml     ---------- LA volume/bsa, ES, 1-p A2C             18.3  ml/m^2 ----------  Mitral valve                           Value        Reference Mitral E-wave peak velocity            71.8  cm/s   ---------- Mitral A-wave peak velocity            60.1  cm/s   ---------- Mitral deceleration time               194   ms     150 - 230 Mitral peak gradient, D                2     mm Hg  ---------- Mitral E/A ratio, peak                 1.2          ----------  Pulmonary arteries                     Value        Reference PA pressure, S, DP                     23    mm Hg  <=30  Tricuspid valve                        Value        Reference Tricuspid regurg peak velocity         224   cm/s   ---------- Tricuspid peak RV-RA gradient          20    mm Hg  ----------  Right atrium                           Value        Reference RA ID, S-I, ES, A4C                    46.7  mm     34 - 49 RA area, ES, A4C                       11.2  cm^2   8.3 - 19.5 RA volume, ES, A/L                     22.2  ml     ---------- RA volume/bsa, ES, A/L                 8.9   ml/m^2 ----------  Systemic veins  Value        Reference Estimated CVP                          3     mm Hg  ----------  Right ventricle                        Value        Reference TAPSE                                  20.9  mm     ---------- RV pressure, S, DP                     23    mm Hg  <=30 RV s&', lateral, S                      11.1  cm/s   ----------  Legend: (L)  and  (H)  mark values outside specified reference range.  ------------------------------------------------------------------- Prepared and Electronically Authenticated by  Maudine Sos,  MD 2018-03-05T19:03:45    MONITORS  LONG TERM MONITOR (3-14 DAYS) 10/16/2019  Narrative QIAN AHRENS, DOB 07-16-1945, MRN [id number]  HOLTER MONITOR REPORT:    Date of test:                 09/30/2019 Duration of test:           7 days Indication:                    Dizziness Ordering physician:  Manfred Seed, MD Referring physician:  Manfred Seed, MD   Baseline rhythm: Sinus  Minimum heart rate: 45 BPM.  Average heart rate: 69 BPM.  Maximal heart rate 121 BPM.  Atrial arrhythmia: Rare PACs noted, no sustained arrhythmia  Ventricular arrhythmia: Rare PVCs noted, no sustained arrhythmia  Conduction abnormality: None  Symptoms: None   Conclusion: Normal monitor  Interpreting  cardiologist: Ralene Burger, MD Date: 10/24/2019 11:46 AM   CT SCANS  CT CORONARY FRACTIONAL FLOW RESERVE DATA PREP 09/14/2019  Narrative EXAM: CT FFR ANALYSIS  CLINICAL DATA:  Chest pain  FINDINGS: FFRct analysis was performed on the original cardiac CT angiogram dataset. Diagrammatic representation of the FFRct analysis is provided in a separate PDF document in PACS. This dictation was created using the PDF document and an interactive 3D model of the results. 3D model is not available in the EMR/PACS. Normal FFR range is >0.80.  1. Left Main:  No significant stenosis. FFR = 0.99  2. LAD: No significant stenosis. Proximal FFR =0.98, Mid FFR =0.89, Distal FFR = 0.85 3. LCX: No significant stenosis. Proximal FFR =0.99, Distal FFR = 0.98; OM1 Proximal FFR = 0.96, Distal FFR = 0.90 4. RCA: No significant stenosis. Proximal FFR =0.99, Mid FFR =0.96, Distal FFR = 0.95  IMPRESSION: 1.  CT FFR analysis not consistent with significant stenosis.  Note: These examples are not recommendations of [employer] and only provided as examples of what other customers are doing.   Electronically Signed By: Alexandria Angel M.D. On: 09/14/2019 17:57   CT CORONARY MORPH W/CTA  COR W/SCORE 09/14/2019  Addendum 09/14/2019  1:12 PM ADDENDUM REPORT: 09/14/2019 13:10  CLINICAL DATA:  78 yo male with chest pain  EXAM: Cardiac/Coronary  CT  TECHNIQUE: The  patient was scanned on a [employer].  FINDINGS: A 120 kV prospective scan was triggered in the descending thoracic aorta at 111 HU's. Axial non-contrast 3 mm slices were carried out through the heart. The data set was analyzed on a dedicated work station and scored using the Agatson method. Gantry rotation speed was 250 msecs and collimation was .6 mm. No beta blockade and 0.8 mg of sl NTG was given. The 3D data set was reconstructed in 5% intervals of the 67-82 % of the R-R cycle. Diastolic phases were analyzed on a dedicated work station using MPR, MIP and VRT modes. The patient received 80 cc of contrast.  Aorta:  Normal size.  Aortic atherosclerosis noted.  No dissection.  Aortic Valve:  Trileaflet.  No calcifications.  Coronary Arteries:  Normal coronary origin.  Right dominance.  RCA is a large dominant artery that gives rise to PDA and PLVB. There is moderate (50-69%) mid calcified RCA stenoses followed by 2 minimal (0-24%) stenoses; there is severe (70-99%) calcified stenosis in the distal vessel prior to PDA.  Left main is a large artery that gives rise to LAD and LCX arteries. There is minimal (0-24%) calcified stenosis.  LAD is a large vessel that gives rise to small D1 and large D2. There is minimal (0-24%) proximal stenosis follow by severe (70-99%) calcified stenosis. There is minimal (0-24%) ostial calcified stenosis in D1 and mild (25-49%) calcified stenosis in D2.  LCX is a non-dominant artery that gives rise to one large OM1 branch and smaller OM2 and OM3. There is no plaque.  Other findings:  Normal pulmonary vein drainage into the left atrium.  Normal let atrial appendage without a thrombus.  Normal size of the pulmonary artery.  IMPRESSION: 1. Coronary calcium   score of 1104. This was 69 percentile for age and sex matched control.  2. Normal coronary origin with right dominance.  3. Severe CAD with 70-99% stenoses noted in proximal LAD and distal RCA. CAD-RADS 4.  4. Aortic atherosclerosis  5. Study will be sent for FFR.  Alexandria Angel   Electronically Signed By: Alexandria Angel M.D. On: 09/14/2019 13:10  Narrative EXAM: OVER-READ INTERPRETATION  CT CHEST  The following report is an over-read performed by radiologist Dr. Alexandria Angel of [facility] on 09/14/2019. This over-read does not include interpretation of cardiac or coronary anatomy or pathology. The coronary calcium  score/coronary CTA interpretation by the cardiologist is attached.  COMPARISON:  None.  FINDINGS: Aortic atherosclerosis. Within the visualized portions of the thorax there are no suspicious appearing pulmonary nodules or masses, there is no acute consolidative airspace disease, no pleural effusions, no pneumothorax and no lymphadenopathy. Moderate-sized hiatal hernia. Visualized portions of the upper abdomen are unremarkable. There are no aggressive appearing lytic or blastic lesions noted in the visualized portions of the skeleton.  IMPRESSION: 1.  Aortic Atherosclerosis (ICD10-I70.0). 2. Moderate-sized hiatal hernia.  Electronically Signed: By: Alexandria Angel M.D. On: 09/14/2019 09:07     ______________________________________________________________________________________________      Risk Assessment/Calculations:             Physical Exam:   VS:  BP 120/70   Pulse (!) 55   Ht 5\' 10"  (1.778 m)   Wt 238 lb (108 kg)   SpO2 95%   BMI 34.15 kg/m    Wt Readings from Last 3 Encounters:  05/17/23 238 lb (108 kg)  11/16/21 245 lb 12.8 oz (111.5 kg)  12/19/20 244 lb (110.7 kg)  GEN: Well nourished, well developed in no acute distress NECK: No JVD; No carotid bruits CARDIAC: RRR, no murmurs, rubs, gallops RESPIRATORY:   Clear to auscultation without rales, wheezing or rhonchi  ABDOMEN: Soft, non-tender, non-distended EXTREMITIES:  No edema; No deformity   ASSESSMENT AND PLAN: .   CAD -mild, nonobstructive. Stable with no anginal symptoms. No indication for ischemic evaluation.  Continue aspirin  81 mg daily, continue Lipitor 10 mg daily, continue metoprolol  25 mg daily, continue nitroglycerin  as needed discharge has not needed. Heart healthy diet and regular cardiovascular exercise encouraged.    Dyslipidemia-most recent LDL was controlled at 61, this is formally monitored by his PCP and he is on Lipitor 10 mg each evening.  Hypertension-blood pressure is well-controlled today, continue metoprolol  25 mg daily.  Hypothyroidism-followed by PCP, currently on supplementation.  Obesity-BMI 34, Heart healthy diet and regular cardiovascular exercise encouraged.            Dispo: Follow-up on a as needed basis, he is residing at [facility].  Signed, Terrance Ferretti, NP

## 2023-05-17 ENCOUNTER — Encounter: Payer: Self-pay | Admitting: Cardiology

## 2023-05-17 ENCOUNTER — Ambulatory Visit: Attending: Cardiology | Admitting: Cardiology

## 2023-05-17 VITALS — BP 120/70 | HR 55 | Ht 70.0 in | Wt 238.0 lb

## 2023-05-17 DIAGNOSIS — Z6834 Body mass index (BMI) 34.0-34.9, adult: Secondary | ICD-10-CM

## 2023-05-17 DIAGNOSIS — E66811 Obesity, class 1: Secondary | ICD-10-CM

## 2023-05-17 DIAGNOSIS — I2510 Atherosclerotic heart disease of native coronary artery without angina pectoris: Secondary | ICD-10-CM

## 2023-05-17 DIAGNOSIS — E6609 Other obesity due to excess calories: Secondary | ICD-10-CM

## 2023-05-17 DIAGNOSIS — E782 Mixed hyperlipidemia: Secondary | ICD-10-CM

## 2023-05-17 DIAGNOSIS — I10 Essential (primary) hypertension: Secondary | ICD-10-CM | POA: Diagnosis not present

## 2023-05-17 MED ORDER — METOPROLOL SUCCINATE ER 25 MG PO TB24: 25.0000 mg | ORAL_TABLET | Freq: Every day | ORAL | 0 refills | Status: DC

## 2023-05-17 NOTE — Patient Instructions (Signed)
 Medication Instructions:  Your physician recommends that you continue on your current medications as directed. Please refer to the Current Medication list given to you today.  *If you need a refill on your cardiac medications before your next appointment, please call your pharmacy*  Lab Work: None If you have labs (blood work) drawn today and your tests are completely normal, you will receive your results only by: MyChart Message (if you have MyChart) OR A paper copy in the mail If you have any lab test that is abnormal or we need to change your treatment, we will call you to review the results.  Testing/Procedures: None  Follow-Up: At [facility], you and your health needs are our priority.  As part of our continuing mission to provide you with exceptional heart care, our providers are all part of one team.  This team includes your primary Cardiologist (physician) and Advanced Practice Providers or APPs (Physician Assistants and Nurse Practitioners) who all work together to provide you with the care you need, when you need it.  Your next appointment:   Follow up as needed  Provider:   Pattricia Bores, NP Georgeana Kindler)    We recommend signing up for the patient portal called "MyChart".  Sign up information is provided on this After Visit Summary.  MyChart is used to connect with patients for Virtual Visits (Telemedicine).  Patients are able to view lab/test results, encounter notes, upcoming appointments, etc.  Non-urgent messages can be sent to your provider as well.   To learn more about what you can do with MyChart, go to [email or web address].   Other Instructions None

## 2023-05-29 ENCOUNTER — Telehealth: Payer: Self-pay | Admitting: Cardiology

## 2023-05-29 MED ORDER — METOPROLOL SUCCINATE ER 25 MG PO TB24: 25.0000 mg | ORAL_TABLET | Freq: Every day | ORAL | 3 refills | Status: AC

## 2023-05-29 NOTE — Telephone Encounter (Signed)
 RX corrected and sent to requested Pharmacy

## 2023-05-29 NOTE — Telephone Encounter (Signed)
*  STAT* If patient is at the pharmacy, call can be transferred to refill team.   1. Which medications need to be refilled? (please list name of each medication and dose if known)   metoprolol  succinate (TOPROL -XL) 25 MG 24 hr tablet   2. Which pharmacy/location (including street and city if local pharmacy) is medication to be sent to? [id number] - [place], [place] - [address] Phone: [phone]  Fax: [phone]     3. Do they need a 30 day or 90 day supply? 90  Pt had appt 05/17/23 please take off message that pt needs appt

## 2023-05-31 ENCOUNTER — Other Ambulatory Visit: Payer: Self-pay | Admitting: Cardiology
